# Patient Record
Sex: Female | Born: 1955 | Race: White | Hispanic: No | Marital: Married | Smoking: Never smoker
Health system: Southern US, Community
[De-identification: ages and names within clinical notes are randomized; demographics above are authoritative.]

## PROBLEM LIST (undated history)

## (undated) DIAGNOSIS — Z9109 Other allergy status, other than to drugs and biological substances: Secondary | ICD-10-CM

## (undated) DIAGNOSIS — IMO0002 Reserved for concepts with insufficient information to code with codable children: Secondary | ICD-10-CM

## (undated) DIAGNOSIS — S0291XA Unspecified fracture of skull, initial encounter for closed fracture: Secondary | ICD-10-CM

## (undated) DIAGNOSIS — G8929 Other chronic pain: Secondary | ICD-10-CM

## (undated) DIAGNOSIS — R269 Unspecified abnormalities of gait and mobility: Principal | ICD-10-CM

## (undated) DIAGNOSIS — M549 Dorsalgia, unspecified: Secondary | ICD-10-CM

## (undated) DIAGNOSIS — S060XAA Concussion with loss of consciousness status unknown, initial encounter: Secondary | ICD-10-CM

## (undated) DIAGNOSIS — B009 Herpesviral infection, unspecified: Secondary | ICD-10-CM

## (undated) DIAGNOSIS — E039 Hypothyroidism, unspecified: Secondary | ICD-10-CM

## (undated) DIAGNOSIS — S060X9A Concussion with loss of consciousness of unspecified duration, initial encounter: Secondary | ICD-10-CM

## (undated) DIAGNOSIS — C4490 Unspecified malignant neoplasm of skin, unspecified: Secondary | ICD-10-CM

## (undated) DIAGNOSIS — K589 Irritable bowel syndrome without diarrhea: Secondary | ICD-10-CM

## (undated) DIAGNOSIS — N951 Menopausal and female climacteric states: Secondary | ICD-10-CM

## (undated) DIAGNOSIS — M1990 Unspecified osteoarthritis, unspecified site: Secondary | ICD-10-CM

## (undated) DIAGNOSIS — C4491 Basal cell carcinoma of skin, unspecified: Secondary | ICD-10-CM

## (undated) DIAGNOSIS — Z87828 Personal history of other (healed) physical injury and trauma: Secondary | ICD-10-CM

## (undated) HISTORY — DX: Herpesviral infection, unspecified: B00.9

## (undated) HISTORY — DX: Dorsalgia, unspecified: M54.9

## (undated) HISTORY — DX: Other allergy status, other than to drugs and biological substances: Z91.09

## (undated) HISTORY — PX: TENDON REPAIR: SHX5111

## (undated) HISTORY — DX: Concussion with loss of consciousness of unspecified duration, initial encounter: S06.0X9A

## (undated) HISTORY — DX: Irritable bowel syndrome, unspecified: K58.9

## (undated) HISTORY — PX: COLONOSCOPY: SHX174

## (undated) HISTORY — DX: Personal history of other (healed) physical injury and trauma: Z87.828

## (undated) HISTORY — DX: Menopausal and female climacteric states: N95.1

## (undated) HISTORY — DX: Basal cell carcinoma of skin, unspecified: C44.91

## (undated) HISTORY — DX: Other chronic pain: G89.29

## (undated) HISTORY — PX: OTHER SURGICAL HISTORY: SHX169

## (undated) HISTORY — DX: Unspecified osteoarthritis, unspecified site: M19.90

## (undated) HISTORY — DX: Unspecified fracture of skull, initial encounter for closed fracture: S02.91XA

## (undated) HISTORY — DX: Hypothyroidism, unspecified: E03.9

## (undated) HISTORY — DX: Concussion with loss of consciousness status unknown, initial encounter: S06.0XAA

## (undated) HISTORY — PX: COSMETIC SURGERY: SHX468

## (undated) HISTORY — DX: Unspecified abnormalities of gait and mobility: R26.9

## (undated) HISTORY — PX: ORIF ANKLE FRACTURE: SUR919

## (undated) HISTORY — PX: CARPAL TUNNEL RELEASE: SHX101

## (undated) HISTORY — DX: Unspecified malignant neoplasm of skin, unspecified: C44.90

## (undated) HISTORY — DX: Reserved for concepts with insufficient information to code with codable children: IMO0002

---

## 1988-12-03 HISTORY — PX: BREAST CYST ASPIRATION: SHX578

## 1990-12-03 HISTORY — PX: TUBAL LIGATION: SHX77

## 1998-10-26 ENCOUNTER — Other Ambulatory Visit: Admission: RE | Admit: 1998-10-26 | Discharge: 1998-10-26 | Payer: Self-pay | Admitting: Obstetrics and Gynecology

## 2000-03-22 ENCOUNTER — Other Ambulatory Visit: Admission: RE | Admit: 2000-03-22 | Discharge: 2000-03-22 | Payer: Self-pay | Admitting: Obstetrics and Gynecology

## 2000-10-25 ENCOUNTER — Inpatient Hospital Stay (HOSPITAL_COMMUNITY): Admission: EM | Admit: 2000-10-25 | Discharge: 2000-10-28 | Payer: Self-pay

## 2001-04-23 ENCOUNTER — Ambulatory Visit (HOSPITAL_BASED_OUTPATIENT_CLINIC_OR_DEPARTMENT_OTHER): Admission: RE | Admit: 2001-04-23 | Discharge: 2001-04-23 | Payer: Self-pay | Admitting: *Deleted

## 2002-03-04 ENCOUNTER — Ambulatory Visit (HOSPITAL_BASED_OUTPATIENT_CLINIC_OR_DEPARTMENT_OTHER): Admission: RE | Admit: 2002-03-04 | Discharge: 2002-03-04 | Payer: Self-pay | Admitting: *Deleted

## 2002-11-15 ENCOUNTER — Emergency Department (HOSPITAL_COMMUNITY): Admission: EM | Admit: 2002-11-15 | Discharge: 2002-11-15 | Payer: Self-pay | Admitting: Emergency Medicine

## 2003-12-04 HISTORY — PX: HYSTEROSCOPY WITH NOVASURE: SHX5574

## 2005-12-03 HISTORY — PX: FRACTURE SURGERY: SHX138

## 2005-12-12 ENCOUNTER — Ambulatory Visit (HOSPITAL_BASED_OUTPATIENT_CLINIC_OR_DEPARTMENT_OTHER): Admission: RE | Admit: 2005-12-12 | Discharge: 2005-12-12 | Payer: Self-pay | Admitting: *Deleted

## 2006-02-02 ENCOUNTER — Ambulatory Visit (HOSPITAL_COMMUNITY): Admission: RE | Admit: 2006-02-02 | Discharge: 2006-02-02 | Payer: Self-pay | Admitting: Orthopedic Surgery

## 2006-05-13 ENCOUNTER — Emergency Department: Payer: Self-pay | Admitting: General Practice

## 2006-05-16 ENCOUNTER — Emergency Department: Payer: Self-pay | Admitting: Emergency Medicine

## 2007-03-29 ENCOUNTER — Emergency Department (HOSPITAL_COMMUNITY): Admission: EM | Admit: 2007-03-29 | Discharge: 2007-03-29 | Payer: Self-pay | Admitting: *Deleted

## 2007-04-01 ENCOUNTER — Encounter: Admission: RE | Admit: 2007-04-01 | Discharge: 2007-04-01 | Payer: Self-pay | Admitting: *Deleted

## 2007-04-14 DIAGNOSIS — C4491 Basal cell carcinoma of skin, unspecified: Secondary | ICD-10-CM

## 2007-04-14 HISTORY — DX: Basal cell carcinoma of skin, unspecified: C44.91

## 2007-08-15 ENCOUNTER — Ambulatory Visit: Payer: Self-pay | Admitting: Unknown Physician Specialty

## 2007-08-21 ENCOUNTER — Ambulatory Visit: Payer: Self-pay | Admitting: Unknown Physician Specialty

## 2008-01-22 ENCOUNTER — Ambulatory Visit: Payer: Self-pay | Admitting: Internal Medicine

## 2008-02-03 ENCOUNTER — Ambulatory Visit: Payer: Self-pay | Admitting: Internal Medicine

## 2008-09-08 ENCOUNTER — Ambulatory Visit: Payer: Self-pay | Admitting: Family Medicine

## 2009-04-11 ENCOUNTER — Ambulatory Visit: Payer: Self-pay | Admitting: Unknown Physician Specialty

## 2009-04-14 ENCOUNTER — Ambulatory Visit: Payer: Self-pay | Admitting: Oral and Maxillofacial Surgery

## 2010-06-11 ENCOUNTER — Emergency Department: Payer: Self-pay | Admitting: Unknown Physician Specialty

## 2010-09-12 ENCOUNTER — Ambulatory Visit: Payer: Self-pay | Admitting: Unknown Physician Specialty

## 2010-12-03 HISTORY — PX: BACK SURGERY: SHX140

## 2011-04-20 NOTE — Op Note (Signed)
NAMECHRISTIEN, BERTHELOT                ACCOUNT NO.:  [id number]   MEDICAL RECORD NO.:  [id number]          PATIENT TYPE:  AMB   LOCATION:  DSC                          FACILITY:  [facility]   PHYSICIAN:  Lowell Bouton, M.D.DATE OF BIRTH:  [birth year]   DATE OF PROCEDURE:  12/12/2005  DATE OF DISCHARGE:                                 OPERATIVE REPORT   PREOP DIAGNOSIS:  Status post open reduction internal fixation left humerus.   POSTOP DIAGNOSIS:  Status post open reduction internal fixation left  humerus.   PROCEDURE:  Removal of plate and screws left humerus.   SURGEON:  Lowell Bouton, M.D.   ANESTHESIA:  General.   OPERATIVE FINDINGS:  The patient had only mild scarring, but did have some  bone in growth around the screw holes. The fracture was healed.   DESCRIPTION OF PROCEDURE:  Procedure under general anesthesia, the left arm  was prepped and draped in usual fashion and a longitudinal incision was made  in line with the previous scar using an anterolateral approach. Sharp  dissection was carried through the subcutaneous tissues and bleeding points  were coagulated. Blunt dissection was carried down to the muscle and  Weitlaner retractors were inserted for retraction. The muscle was then  split, bluntly, down to the plate; and the plate was exposed both proximally  and distally. Care was taken to protect the lateral antebrachial cutaneous  nerve and the radial nerve. Five screws were then removed; and an osteotome  was used to remove bone from the screw holes. The plate was then elevated  off of the humerus. The lag screw was removed anteriorly. Rongeur was used  to remove any corrosion around the screw holes.   The wound was irrigated copiously with saline. The fascia was closed with 4-  0 Vicryl. A vessel loop drain was left in for drainage.  Subcutaneous tissue  was closed with 4-0 Vicryl. The skin was closed with a 3-0 subcuticular  Prolene; 1/2% Marcaine  was inserted into the wound edges for pain control.  Steri-Strips were applied, followed by a sterile dressing. The patient  tolerated the procedure well, and went to recovery room awake and stable in  good condition.      Lowell Bouton, M.D.  Electronically Signed     EMM/MEDQ  D:  12/12/2005  T:  12/12/2005  Job:  [id number]

## 2011-04-20 NOTE — Op Note (Signed)
[name]. [facility]  Patient:    [name], [name]                       MRN: [id number] Adm. Date:  [id number] Attending:  Kendell Bane                           Operative Report  PREOPERATIVE DIAGNOSIS:  Status post both-bones forearm fracture, left, humerus fracture, left, and severe injury to the dorsum of the left forearm with posterior interosseous nerve transection.  POSTOPERATIVE DIAGNOSIS:  Status post both-bones forearm fracture, left, humerus fracture, left, and severe injury to the dorsum of the left forearm with posterior interosseous nerve transection.  PROCEDURE:  Tendon transfers with flexor carpi radialis to the extensor digitorum communis and palmaris longus tendon to the extensor pollicis longus tendon, left forearm.  SURGEON:  Lowell Bouton, M.D.  ANESTHESIA:  General.  OPERATIVE FINDINGS:  The patient had significant scarring on the dorsum of the forearm.  The extensor tendons were in good condition from the distal forearm out to the hand.  The subcutaneous tunnel between the FCR and the extensor tendons did have some scar tissue that was released.  The palmaris longus to EPL tendon transfer did not have significant scarring.  DESCRIPTION OF PROCEDURE:  Under general anesthesia with a tourniquet on the left arm, the left arm was prepped and draped in the usual fashion, and after exsanguinating the limb, the tourniquet was inflated to 250 mmHg.  A longitudinal incision was made volarly between the FCR and the palmaris longus tendon.  Blunt dissection was carried down to the tendons, and they were transected at their insertion in the wrist.  They were dissected out proximally up to the muscle bellies.  A longitudinal incision was then made over the dorsum of the wrist, extending back onto the forearm.  Sharp dissection was carried down through the subcutaneous tissues, and bleeding points were coagulated.  The  saphenous vein was tied off with a 4-0 chromic suture.  Blunt dissection was done through scar tissue down to the extensor tendons.  The extensor retinaculum was incised, and the tendons were transected at their musculotendinous junction.  The extensor digitorum communis of the central three digits were used for each tendon, and the extensor digiti minimi of the small finger.  They were all transected and brought out through the extensor retinaculum superficial to it in the hand. The flexor carpi radialis was then tunneled subcutaneously in the proximal forearm over the radial side of the radius onto the dorsum of the forearm. The extensor tendons were woven into the flexor carpi radialis tendon using a tendon braider and 4-0 Mersilene suture.  Each tendon was attached twice and braided through the FCR twice.  Four tendons were inserted.  The EIP was left intact.  The tension was set with the wrist in slight extension and the MP joints fully extended.  Maximum pull was placed on the flexor carpi radialis tendon.  After the transfer, the MP extended fully with slight wrist flexion, and a passive fist could be made with wrist extension.  The palmaris longus was then braided into the extensor pollicis longus after bringing the EPL out of the extensor sheath and subcutaneously over the snuffbox.  Mersilene 4-0 was used, and the excess tendon ends were removed.  Again there was good extension of the IP joint of the thumb with good passive  flexion.  The wounds were irrigated copiously.  Bleeding was controlled with electrocautery. Vessel loop drains were left in for drainage, and the subcutaneous tissues were closed with 4-0 Vicryl and the skin with a 3-0 subcuticular Prolene. Steri-Strips were applied, followed by sterile dressings.  The patient was placed in a volar splint with the MPs extended and the IP of the thumb extended.  She tolerated the procedure well and went to the recovery  room awake and stable, in good condition. DD:  04/23/01 TD:  04/23/01 Job: [id number]

## 2011-04-20 NOTE — Op Note (Signed)
NAMEJASNEET, [name]                ACCOUNT NO.:  [id number]   MEDICAL RECORD NO.:  [id number]          PATIENT TYPE:  AMB   LOCATION:  SDS                          FACILITY:  [facility]   PHYSICIAN:  Nadara Mustard, MD     DATE OF BIRTH:  1956-03-15   DATE OF PROCEDURE:  02/02/2006  DATE OF DISCHARGE:  02/02/2006                                 OPERATIVE REPORT   PREOPERATIVE DIAGNOSIS:  Left ankle Weber B fibular fracture.   POSTOPERATIVE DIAGNOSIS:  Left ankle Weber B fibular fracture.   PROCEDURE:  Open reduction and internal fixation of left fibula with locking  plate.   SURGEON:  Nadara Mustard, MD   ANESTHESIA:  General.   ESTIMATED BLOOD LOSS:  Minimal.   ANTIBIOTICS:  One gram of Kefzol.   DRAINS:  None.   COMPLICATIONS:  None.   TOURNIQUET TIME:  None.   DISPOSITION:  To PACU in stable condition.   INDICATION FOR PROCEDURE:  The patient is a 55 year old woman who had her  left ankle stepped on by a horse.  She sustained a closed Weber B fracture  of the fibula.  The deltoid was intact.  Her ankle mortise was congruent.  The patient had displacement of the fibula and presents at this time for  ORIF of the Weber B fibular fracture.  Risks and benefits were discussed  including infection, neurovascular injury, persistent pain, need for  additional surgery, and risk of arthritis.  The patient states he  understands and wishes to proceed at this time.   DESCRIPTION OF PROCEDURE:  The patient was brought to OR room 8 and  underwent a general anesthetic.  After an adequate level of anesthesia was  obtained, the patient's left lower extremity was prepped using DuraPrep and  draped into a sterile field.  A lateral incision was made through the skin  and this was carried sharply down to bone.  Subperiosteal dissection was  used to debride the fracture site.  There was a significant amount of soft  tissue stripping from the traumatic injury.  The wound was irrigated with  normal saline and the fracture was reduced; this was more of a transverse  fracture than a typical oblique Weber B fracture and there was no  interfragmentary screw placed due to the transverse nature of the fracture.  A Synthes locking plate was placed laterally with 3 locking screws  proximally and 2 locking screws distally.  C-arm fluoroscopy verified  reduction in both AP and lateral planes.  The wound was irrigated with  normal saline.  Subcu was closed using 2-0 Vicryl.  The skin was closed  using approximating staples and there was no tension on the skin.  The wound  was covered Adaptic, orthopedic  sponges, sterile Webril and a Coban dressing.  The patient was placed back  in a fracture boot, extubated and taken to PACU in stable condition.   Plan for discharge to home and followup in the office in 2 weeks.      Nadara Mustard, MD  Electronically Signed  MVD/MEDQ  D:  02/02/2006  T:  02/04/2006  Job:  [id number]

## 2011-04-20 NOTE — Op Note (Signed)
[place]. [facility]  Patient:    [name], [name] Visit Number: [id number] MRN: [id number]          Service Type: DSU Location: [facility] Attending Physician:  Kendell Bane Dictated by:   Lowell Bouton, M.D. Proc. Date: 03/05/02 Admit Date:  03/04/2002 Discharge Date: 03/04/2002                             Operative Report  PREOPERATIVE DIAGNOSIS:  Right carpal tunnel syndrome.  POSTOPERATIVE DIAGNOSIS:  Right carpal tunnel syndrome.  OPERATION PERFORMED:  Decompression median nerve, right carpal tunnel.  SURGEON:  Lowell Bouton, M.D.  ANESTHESIA:  0.5% Marcaine local with sedation.  OPERATIVE FINDINGS:  The patient had no masses present in the carpal canal. The motor branch of the nerve was intact.  DESCRIPTION OF PROCEDURE:  Under 0.5% Marcaine local anesthesia with a tourniquet on the right arm, the right hand was prepped and draped in the usual fashion and after exsanguinating the limb, the tourniquet was inflated to 225 mmHg.  A 3 cm longitudinal incision was made in the palm just ulnar to the thenar crease.  Sharp dissection was carried through the subcutaneous tissues and bleeding points were coagulated.  Blunt dissection was carried down through the superficial palmar fascia distal to the transverse carpal ligament.  A hemostat was placed in the carpal canal up against the hook of the hamate and the transverse carpal ligament was divided on the ulnar border of the median nerve.  The proximal end of the ligament was divided with the scissors after dissecting the nerve away from the undersurface of the ligament.  The carpal canal was then palpated and was found to be adequately decompressed.  The nerve was examined and motor branch identified.  The wound was irrigated with saline and the skin was closed with 4-0 nylon sutures. Sterile dressings were applied followed by a volar wrist splint.  The  patient tolerated the procedure well and went to the recovery room awake and stable in good condition. Dictated by:   Lowell Bouton, M.D. Attending Physician:  Kendell Bane DD:  03/05/02 TD:  03/05/02 Job: [id number]

## 2011-04-20 NOTE — H&P (Signed)
. [facility]  Patient:    [name], [name]                       MRN: [id number] Adm. Date:  [id number] Attending:  Trauma, Md                         History and Physical  CHIEF COMPLAINT:  Auger injury, left forearm.  HISTORY OF PRESENT ILLNESS:  The patient is a 55 year old left-handed female who caught her left forearm in an auger and suffered a twisting injury, also fracturing her humerus.  She presented to the emergency room with a grossly contaminated wound on the dorsum of her forearm.  She was found to be unstable at both the forearm and the humerus.  PAST MEDICAL HISTORY:  Past medical history is remarkable for a C-section.  CURRENT MEDICATIONS:  Synthroid and Celebrex.  ALLERGIES:  PENICILLIN -- she develops a rash.  DEMEROL makes her nauseated.  FAMILY HISTORY:  Negative.  SOCIAL HISTORY:  She does not smoke, drinks moderately and has one child and lives with her husband.  [place]  REVIEW OF SYSTEMS:  Negative.  PHYSICAL EXAMINATION  HEENT:  Pupils are equal, round and reactive to light.  Oropharynx is clear. Nasal septum is midline.  NECK:  No adenopathy or bruit.  CHEST:  Clear to auscultation.  HEART:  Regular without a murmur.  ABDOMEN:  Soft, nontender without masses.  EXTREMITIES:  Exam is normal with the exception of the left upper extremity. There is no open wound over the humerus but an obvious fracture with an unstable humerus.  There is a very large wound with all the muscle bellies protruding dorsally on the forearm.  There is a 4-cm hypothenar laceration. Sensation is completely intact and circulation is present.  X-RAY FINDINGS:  X-rays reveal an oblique fracture of the humerus with segmental fractures of the radius and ulna.  DIAGNOSIS:  Open fracture, left forearm, with lacerated extensor tendons and closed fracture, left humerus.  PLAN:  The patient was taken to the operating room where she  underwent repair of all her injuries.  She will be admitted postoperatively for IV antibiotics. DD:  10/25/00 TD:  10/26/00 Job: [id number]

## 2011-04-20 NOTE — Op Note (Signed)
[place]. [facility]  Patient:    [name], [name]                       MRN: [id number] Proc. Date: 10/25/00 Adm. Date:  [id number] Attending:  Kendell Bane                           Operative Report  PREOPERATIVE DIAGNOSIS:  Left closed mid shaft humerus fracture.  POSTOPERATIVE DIAGNOSIS:  Left closed mid shaft humerus fracture.  PROCEDURE:  Open reduction, internal fixation, and compression plating, left mid shaft humerus fracture.  SURGEON:  Mark C. Ophelia Charter, M.D.  ASSISTANT:  Lowell Bouton, M.D.  ANESTHESIA:  General.  TOURNIQUET:  None.  This surgery was done prior to Dr. Metro Kung surgery which was on the forearm with plating of the both bone forearm fracture and repair of open dorsal mid forearm tendon and muscle lacerations.  The patient had a floating elbow and humerus stabilization was required prior to plating and fixation of the comminuted radius and ulnar fractures.  Please see her operative note for description of the forearm procedures.  DESCRIPTION OF PROCEDURE:  After induction of general anesthesia oral tracheal intubation, the arm was prepped and scrubbed from the fingertips up to the axilla, and the shoulder region was squared off with towels.  Uderates were used, stockinette with Coban and a green towel applied to the forearm over the open injuries and wrapped with pressure to decrease intraoperative blood loss. Sterile skin marker was used with an anterolateral approach to the humerus. Radial nerve was identified after blunt dissection in its normal interval and followed from distal to proximal carefully fraying it up.  Subperiosteal dissection was performed on the distal fragment of the humerus with care taken to make sure the radial nerve was loose and free.  Proximal fragment was medial and anterior and once the nerve was freed up past the proximal fragment, the fracture was reduced, held with  self-retaining clamps in an anatomic position.  A 4.5 compression plate was selected, applied to the humerus and then removed and bent slightly for the slight ______ in the distal humerus.  Due to the round shape of the humerus, the plate extended slightly from posteroproximal to the anterodistal but held the fracture in the anatomic position.  An air fragmentary screw was applied first holding the oblique short fracture in the anatomic position, and the compression plate with all holes drilled in neutral position using the Green drill guide.  The screws were measured; self-tapping 4.5 cortical screws were inserted.  Radial nerve was carefully inspected and checked throughout the procedure and was loose at the end of procedure and undamaged.  The patient was neurologically intact prior to the injury and after irrigation with saline solution, subcutaneous tissue was reapproximated with 2-0 Vicryl.  There was minimal bleeding and no drain was placed, and skin was closed with a running nylon suture so that a sterile tourniquet could be applied for the forearm procedure as described in Dr. Metro Kung operative note.  At the conclusion of this procedure,  the sterile tourniquet was applied and forearm tendons, muscle and compression plate fixation was performed on the forearm. DD:  10/28/11 TD:  10/27/00 Job: [id number]

## 2011-09-02 ENCOUNTER — Emergency Department (HOSPITAL_COMMUNITY): Payer: PRIVATE HEALTH INSURANCE

## 2011-09-02 ENCOUNTER — Inpatient Hospital Stay (HOSPITAL_COMMUNITY): Payer: PRIVATE HEALTH INSURANCE

## 2011-09-02 ENCOUNTER — Encounter (HOSPITAL_COMMUNITY): Payer: Self-pay | Admitting: Radiology

## 2011-09-02 ENCOUNTER — Inpatient Hospital Stay (HOSPITAL_COMMUNITY)
Admission: EM | Admit: 2011-09-02 | Discharge: 2011-09-08 | DRG: 460 | Disposition: A | Discharge status: Home / Self Care | Payer: PRIVATE HEALTH INSURANCE | Attending: Neurosurgery | Admitting: Neurosurgery

## 2011-09-02 DIAGNOSIS — Y9389 Activity, other specified: Secondary | ICD-10-CM

## 2011-09-02 DIAGNOSIS — S32009A Unspecified fracture of unspecified lumbar vertebra, initial encounter for closed fracture: Principal | ICD-10-CM | POA: Diagnosis present

## 2011-09-02 DIAGNOSIS — Y998 Other external cause status: Secondary | ICD-10-CM

## 2011-09-02 DIAGNOSIS — W1789XA Other fall from one level to another, initial encounter: Secondary | ICD-10-CM | POA: Diagnosis present

## 2011-09-02 DIAGNOSIS — Y9239 Other specified sports and athletic area as the place of occurrence of the external cause: Secondary | ICD-10-CM

## 2011-09-02 LAB — URINE MICROSCOPIC-ADD ON

## 2011-09-02 LAB — CBC
Hemoglobin: 12.7 g/dL (ref 12.0–15.0)
MCH: 29.9 pg (ref 26.0–34.0)
MCV: 87.5 fL (ref 78.0–100.0)
RBC: 4.25 MIL/uL (ref 3.87–5.11)

## 2011-09-02 LAB — COMPREHENSIVE METABOLIC PANEL
Albumin: 3.8 g/dL (ref 3.5–5.2)
Alkaline Phosphatase: 50 U/L (ref 39–117)
BUN: 20 mg/dL (ref 6–23)
Chloride: 101 mEq/L (ref 96–112)
Potassium: 3.1 mEq/L — ABNORMAL LOW (ref 3.5–5.1)
Total Bilirubin: 0.2 mg/dL — ABNORMAL LOW (ref 0.3–1.2)

## 2011-09-02 LAB — URINALYSIS, ROUTINE W REFLEX MICROSCOPIC
Bilirubin Urine: NEGATIVE
Leukocytes, UA: NEGATIVE
Nitrite: NEGATIVE
Specific Gravity, Urine: 1.01 (ref 1.005–1.030)
pH: 7.5 (ref 5.0–8.0)

## 2011-09-02 LAB — POCT I-STAT, CHEM 8
BUN: 20 mg/dL (ref 6–23)
Creatinine, Ser: 0.7 mg/dL (ref 0.50–1.10)
Hemoglobin: 13.6 g/dL (ref 12.0–15.0)
Potassium: 3.2 mEq/L — ABNORMAL LOW (ref 3.5–5.1)
Sodium: 140 mEq/L (ref 135–145)

## 2011-09-02 LAB — PROTIME-INR: Prothrombin Time: 12.6 seconds (ref 11.6–15.2)

## 2011-09-02 LAB — LACTIC ACID, PLASMA: Lactic Acid, Venous: 2.2 mmol/L (ref 0.5–2.2)

## 2011-09-02 LAB — ABO/RH: ABO/RH(D): O POS

## 2011-09-02 MED ORDER — IOHEXOL 300 MG/ML  SOLN
80.0000 mL | Freq: Once | INTRAMUSCULAR | Status: AC | PRN
  Administered 2011-09-02: 80 mL via INTRAVENOUS

## 2011-09-03 LAB — CBC
MCH: 29.8 pg (ref 26.0–34.0)
MCHC: 33.6 g/dL (ref 30.0–36.0)
MCV: 88.9 fL (ref 78.0–100.0)
Platelets: 160 10*3/uL (ref 150–400)
RDW: 13.8 % (ref 11.5–15.5)

## 2011-09-05 ENCOUNTER — Inpatient Hospital Stay (HOSPITAL_COMMUNITY): Payer: PRIVATE HEALTH INSURANCE

## 2011-09-06 DIAGNOSIS — S32009A Unspecified fracture of unspecified lumbar vertebra, initial encounter for closed fracture: Secondary | ICD-10-CM

## 2011-09-07 NOTE — H&P (Signed)
[name]                ACCOUNT NO.:  [id number]  MEDICAL RECORD NO.:  [id number]  LOCATION:  [facility]                         FACILITY:  [facility]  PHYSICIAN:  Hilda Lias, M.D.   DATE OF BIRTH:  March 09, 1956  DATE OF ADMISSION:  09/02/2011 DATE OF DISCHARGE:                             HISTORY & PHYSICAL   [name] is a 55 year old female who today was in a beer stand and she fell.  She landed on her back.  Immediately, she complained of lower back pain and tingling sensation in both the legs.  She denies any loss of consciousness.  She was got into the hospital.  Because of the findings, she was now admitted to [facility] to have a complete evaluation per the emergency room physician, Dr. Nicanor Alcon.  Because of the finding, we were called.  She fell from a height of about 15 feet. At the present time, she is complaining of intensive lower back pain, quite sensitive in both legs.  PAST MEDICAL HISTORY:  She had surgery of the hand and fusion of the right ankle.  She is not allergic to any medication.  SOCIAL HISTORY:  She drinks socially.  FAMILY HISTORY:  Unremarkable.  REVIEW OF SYSTEMS:  Positive mostly for some headache.  MEDICATIONS:  She is taking Celebrex and some other medication that she does not recall.  PHYSICAL EXAMINATION:  VITAL SIGNS:  Blood pressure 130/80, pulse of 86, respiratory rate of 16, temperature is 97.8. HEAD, EARS, THROAT:  Normal. NECK:  She is in a cervical collar.  She has no tenderness to palpation anterior-posterior. LUNGS:  Clear. HEART:  Sound normal. ABDOMEN:  Normal. EXTREMITIES:  Normal pulse. NEUROLOGIC:  She is oriented x3.  Strength normal in the upper extremity.  She is able to move both legs.  There is no weakness that we can see, although there is quite a restriction secondary to the neuropathic pain that she is complaining about.  Reflexes 2+.  No Babinski.  Sensation, she complains of hypesthesias in the  lower extremity.  Appropriate proprioception is normal. RECTAL:  Done by the emergency room doctor and was completely normal.  The CT scan of the head is negative.  The CT scan of the cervical spine showed degenerative changes, but no fracture.  Thoracic spine normal. The CT scan of the abdomen and pelvis, there is no evidence of injury of abdominal cavity, but there is a fracture of L1 burst with 50% compromise of the canal.  CLINICAL IMPRESSION:  Fracture of L1 with 50% canal compromise.  RECOMMENDATIONS:  Since the x-ray of the cervical spine is negative, she is going to be off the collar.  She is going to be eventually seen by the Trauma Service just to be sure that there is no other associated injury with the effect of the lumbar spine.  We are going to do some reconstruction of the thoracolumbar area.  I showed the x-ray to the hospital and he is fully aware that she is going to need surgery urgently with emergency.  I told him and probably we should be able to the surgery today about 9 to 11 o'clock in the morning.  Nevertheless, since she has this surgery performed by orthopedic surgeon, I told her to feel free to get a second opinion by any of the orthopedic surgeon that he knows about.  I told that we encourage second opinion.  The procedure so far will be fusion with pedicle screws from T10-T11 to L1- L2.  This will be also with posterolateral arthrodesis using allograft and BMP.  The surgery of course including possibility of CSF leak, infection, hematoma, paralysis, and need for surgery.          ______________________________ Hilda Lias, M.D.     EB/MEDQ  D:  09/02/2011  T:  09/02/2011  Job:  [id number]  Electronically Signed by Hilda Lias M.D. on 09/07/2011 06:18:36 PM

## 2011-09-07 NOTE — Op Note (Signed)
[name], [name]                ACCOUNT NO.:  [id number]  MEDICAL RECORD NO.:  [id number]  LOCATION:                                 FACILITY:  PHYSICIAN:  Hilda Lias, M.D.   DATE OF BIRTH:  May 28, 1956  DATE OF PROCEDURE: DATE OF DISCHARGE:                              OPERATIVE REPORT   PREOPERATIVE DIAGNOSES:  Fracture of L1.  Bilateral sensory neuropathy.  POSTOPERATIVE DIAGNOSES:  Fracture of L1.  Bilateral sensory neuropathy.  PROCEDURE:  Fuse on the lumbar spine from T11-L3 with pedicle screws. Posterolateral arthrodesis with Vitoss and BMP.  Cell saver.  CR.  SURGEON:  Hilda Lias, MD  ASSISTANT:  Tia Alert, MD  CLINICAL HISTORY:  [name] is a 55 year old female who last night while she was deer hunting, fell from the deer stand.  She landed on her back and had a serious injury on the lumbar area which fracture of L1, compromise of  50% of the canal. she has sensory __________ radiculopathy.  The rectal examination was normal, although injuries were negative.  The patient had a x-ray, which showed fracture of L1 with the canal being compromised about 50%.  Because of that, surgery was advised.  She and her husband knew the risk of the surgery including paralysis and need for further surgery.  PROCEDURE:  The patient was taken to the OR and after intubation, she was positioned in prone manner.  The back was cleaned with DuraPrep.  X- rays showed indeed we were right at the level of T12-L1.  A midline incision from T9 down to L2-L3 was made.  Muscles were retracted laterally and we were able to visualize the transverse process as well as the ribs.  Then using the C-arm, we localized the fracture of L1. When she went from prone to supine position, it was almost completely correction of the defect with minimal bone in the canal.  From there, using the C-arm in the AP and lateral view, we probed the pedicle of T11 and T12 bilaterally.  Each pedicle was  probed and prior to insertion of the screws, we were able to fill the _four quadrants with bone_________.  At this level, we used pedicle screws of 5.5 x 45 __________ at the length at T12 and it was only a length of 40.  Then, we went down below the fracture and we probed the pedicles of L4 and L3.  We were going to use 4 screws of the same length of 5.5 x 45.  X-ray in the AP and lateral view showed that indeed there were good position on the pedicle screws.  Then, the screw were kept in place__________ from T11 to L3 using __rods________.  This procedure was done bilaterally. Then, using the drill, we were able to remove the _periostium_________ lateral aspect of the sacral spine of T10-T11, 11-12, L1, L2, L3; also in the intralaminar aspect from T11, 12, 3.  Then, a mix of BMP and Vitoss was used for arthrodesis.  After we obtained good hemostasis, we proceeded with putting a Hemovac drain.  Then, the wound was closed with Vicryl and __several________ with Steri-Strips.  The patient is going  to go to the ICU.          ______________________________ Hilda Lias, M.D.     EB/MEDQ  D:  09/02/2011  T:  09/03/2011  Job:  [id number]  Electronically Signed by Hilda Lias M.D. on 09/07/2011 06:21:47 PM
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
## 2011-10-08 ENCOUNTER — Ambulatory Visit (HOSPITAL_COMMUNITY)
Admission: RE | Admit: 2011-10-08 | Discharge: 2011-10-08 | Disposition: A | Discharge status: Home / Self Care | Payer: PRIVATE HEALTH INSURANCE | Source: Ambulatory Visit | Attending: Neurosurgery | Admitting: Neurosurgery

## 2011-10-08 DIAGNOSIS — R52 Pain, unspecified: Secondary | ICD-10-CM

## 2011-10-08 DIAGNOSIS — M79609 Pain in unspecified limb: Secondary | ICD-10-CM | POA: Insufficient documentation

## 2011-10-08 DIAGNOSIS — M7989 Other specified soft tissue disorders: Secondary | ICD-10-CM | POA: Insufficient documentation

## 2011-10-08 NOTE — Progress Notes (Signed)
*  PRELIMINARY RESULTS*  Left lower extremity venous duplex has been performed.  Exam is negative for deep and superficial vein thrombosis.  [name] 10/08/2011, 6:07 PM

## 2012-05-20 ENCOUNTER — Ambulatory Visit: Payer: Self-pay | Admitting: Internal Medicine

## 2013-01-28 ENCOUNTER — Other Ambulatory Visit: Payer: Self-pay | Admitting: Neurosurgery

## 2013-01-28 DIAGNOSIS — M542 Cervicalgia: Secondary | ICD-10-CM

## 2013-02-01 ENCOUNTER — Ambulatory Visit
Admission: RE | Admit: 2013-02-01 | Discharge: 2013-02-01 | Disposition: A | Discharge status: Home / Self Care | Payer: PRIVATE HEALTH INSURANCE | Source: Ambulatory Visit | Attending: Neurosurgery | Admitting: Neurosurgery

## 2013-02-01 DIAGNOSIS — M542 Cervicalgia: Secondary | ICD-10-CM

## 2013-05-25 ENCOUNTER — Ambulatory Visit: Payer: Self-pay | Admitting: Internal Medicine

## 2013-05-29 ENCOUNTER — Ambulatory Visit: Payer: Self-pay | Admitting: Physical Medicine and Rehabilitation

## 2013-09-21 ENCOUNTER — Emergency Department (HOSPITAL_COMMUNITY)
Admission: EM | Admit: 2013-09-21 | Discharge: 2013-09-21 | Disposition: A | Discharge status: Home / Self Care | Payer: PRIVATE HEALTH INSURANCE | Attending: Emergency Medicine | Admitting: Emergency Medicine

## 2013-09-21 ENCOUNTER — Encounter (HOSPITAL_COMMUNITY): Payer: Self-pay | Admitting: Emergency Medicine

## 2013-09-21 ENCOUNTER — Emergency Department (HOSPITAL_COMMUNITY): Payer: PRIVATE HEALTH INSURANCE

## 2013-09-21 DIAGNOSIS — Y929 Unspecified place or not applicable: Secondary | ICD-10-CM | POA: Insufficient documentation

## 2013-09-21 DIAGNOSIS — M549 Dorsalgia, unspecified: Secondary | ICD-10-CM

## 2013-09-21 DIAGNOSIS — I10 Essential (primary) hypertension: Secondary | ICD-10-CM | POA: Insufficient documentation

## 2013-09-21 DIAGNOSIS — Z79899 Other long term (current) drug therapy: Secondary | ICD-10-CM | POA: Insufficient documentation

## 2013-09-21 DIAGNOSIS — J45909 Unspecified asthma, uncomplicated: Secondary | ICD-10-CM | POA: Insufficient documentation

## 2013-09-21 DIAGNOSIS — Y9389 Activity, other specified: Secondary | ICD-10-CM | POA: Insufficient documentation

## 2013-09-21 DIAGNOSIS — Z8739 Personal history of other diseases of the musculoskeletal system and connective tissue: Secondary | ICD-10-CM | POA: Insufficient documentation

## 2013-09-21 DIAGNOSIS — E119 Type 2 diabetes mellitus without complications: Secondary | ICD-10-CM | POA: Insufficient documentation
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
## 2013-09-22 NOTE — ED Provider Notes (Signed)
Medical screening examination/treatment/procedure(s) were performed by non-physician practitioner and as supervising physician I was immediately available for consultation/collaboration.  Emelynn Rance F Levell Tavano, MD 09/22/13 0954 

## 2013-09-24 ENCOUNTER — Ambulatory Visit: Payer: Self-pay | Admitting: Internal Medicine

## 2013-11-24 ENCOUNTER — Encounter: Payer: Self-pay | Admitting: Neurology

## 2013-11-27 ENCOUNTER — Encounter: Payer: Self-pay | Admitting: Neurology

## 2013-11-27 ENCOUNTER — Encounter (INDEPENDENT_AMBULATORY_CARE_PROVIDER_SITE_OTHER): Payer: Self-pay

## 2013-11-27 ENCOUNTER — Ambulatory Visit (INDEPENDENT_AMBULATORY_CARE_PROVIDER_SITE_OTHER): Payer: PRIVATE HEALTH INSURANCE | Admitting: Neurology

## 2013-11-27 ENCOUNTER — Other Ambulatory Visit: Payer: Self-pay | Admitting: Neurology

## 2013-11-27 VITALS — BP 112/72 | HR 60 | Temp 97.3°F | Ht 64.0 in | Wt 139.0 lb

## 2013-11-27 DIAGNOSIS — R269 Unspecified abnormalities of gait and mobility: Secondary | ICD-10-CM

## 2013-11-27 HISTORY — DX: Unspecified abnormalities of gait and mobility: R26.9
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
## 2013-11-27 NOTE — Patient Instructions (Signed)

## 2013-12-04 LAB — VITAMIN B12: Vitamin B-12: 796 pg/mL (ref 211–946)

## 2013-12-04 LAB — PLEASE NOTE

## 2013-12-08 ENCOUNTER — Telehealth: Payer: Self-pay | Admitting: *Deleted

## 2013-12-08 NOTE — Telephone Encounter (Signed)
I called pt back after consulting Dr. Jannifer Franklin about her TSH level which had not been done.  He did want to have done.   I spoke with pt and she will come in here to have done. Relayed the hours for this.  She verbalized understanding.

## 2013-12-10 ENCOUNTER — Telehealth: Payer: Self-pay | Admitting: Neurology

## 2013-12-10 ENCOUNTER — Other Ambulatory Visit (INDEPENDENT_AMBULATORY_CARE_PROVIDER_SITE_OTHER): Payer: Self-pay

## 2013-12-10 DIAGNOSIS — R269 Unspecified abnormalities of gait and mobility: Secondary | ICD-10-CM

## 2013-12-10 DIAGNOSIS — Z0289 Encounter for other administrative examinations: Secondary | ICD-10-CM

## 2013-12-10 NOTE — Addendum Note (Signed)
Addended by: Margette Fast on: 12/10/2013 06:53 PM   Modules accepted: Orders

## 2013-12-10 NOTE — Telephone Encounter (Signed)
Left message for patient that her MRI's are without contrast and therefore BUN and Creatinine are not necessary.

## 2013-12-10 NOTE — Telephone Encounter (Signed)
Dr. Jannifer Franklin I re checked her order and see that the MRI cervical is with and without contrast.  Should I add BUN and Creatinine to her lab order?

## 2013-12-10 NOTE — Telephone Encounter (Signed)
The MRI of the cervical spine was ordered with contrast. I think that this was a mistake. I will order a study without contrast. I was unable to discontinue the other order.

## 2013-12-10 NOTE — Telephone Encounter (Signed)
Patient at check out today states that BUN and Creatinine blood test is needed before MRI, however there was no order for it today when she came in for labs and only got TSH. Patient is wondering if the test can be done with the blood that was already taken today. If questions, please call the patient.

## 2013-12-11 LAB — TSH: TSH: 1.59 u[IU]/mL (ref 0.450–4.500)

## 2013-12-16 ENCOUNTER — Other Ambulatory Visit: Payer: PRIVATE HEALTH INSURANCE

## 2013-12-16 ENCOUNTER — Inpatient Hospital Stay: Admission: RE | Admit: 2013-12-16 | Payer: PRIVATE HEALTH INSURANCE | Source: Ambulatory Visit

## 2013-12-19 ENCOUNTER — Ambulatory Visit
Admission: RE | Admit: 2013-12-19 | Discharge: 2013-12-19 | Disposition: A | Discharge status: Home / Self Care | Payer: PRIVATE HEALTH INSURANCE | Source: Ambulatory Visit | Attending: Neurology | Admitting: Neurology

## 2013-12-19 DIAGNOSIS — R269 Unspecified abnormalities of gait and mobility: Secondary | ICD-10-CM

## 2013-12-21 ENCOUNTER — Telehealth: Payer: Self-pay | Admitting: Neurology

## 2013-12-21 NOTE — Telephone Encounter (Signed)
I called patient. The MRI of the brain was reported as normal. The MRI the cervical spine was done, the report is not available. By my view, there is no evidence of spinal cord compression, there is some straightening of the spine. I discussed this with patient. If the formal report is different, I will call the patient. The patient believes that she is doing better with her headaches and pain and tremors on Cymbalta, but she can only tolerate a 30 mg dose. The patient has been working on balance exercises, and she believes that her balance is improving. We'll watch the patient conservatively for now. The blood work done previously to include a B12 level and a thyroid profile was normal.  IMPRESSION: Normal MRI of brain without contrast.

## 2014-05-17 ENCOUNTER — Ambulatory Visit: Payer: Self-pay | Admitting: Adult Health

## 2014-05-19 ENCOUNTER — Ambulatory Visit: Payer: PRIVATE HEALTH INSURANCE | Admitting: Neurology

## 2014-07-13 DIAGNOSIS — M5116 Intervertebral disc disorders with radiculopathy, lumbar region: Secondary | ICD-10-CM | POA: Insufficient documentation

## 2014-07-13 DIAGNOSIS — M48061 Spinal stenosis, lumbar region without neurogenic claudication: Secondary | ICD-10-CM | POA: Insufficient documentation

## 2014-07-21 LAB — HM PAP SMEAR: HM Pap smear: NEGATIVE

## 2014-08-20 ENCOUNTER — Ambulatory Visit: Payer: Self-pay | Admitting: Obstetrics and Gynecology

## 2014-08-20 LAB — HM MAMMOGRAPHY

## 2015-07-27 ENCOUNTER — Ambulatory Visit (INDEPENDENT_AMBULATORY_CARE_PROVIDER_SITE_OTHER): Payer: PRIVATE HEALTH INSURANCE | Admitting: Obstetrics and Gynecology

## 2015-07-27 ENCOUNTER — Encounter: Payer: Self-pay | Admitting: Obstetrics and Gynecology

## 2015-07-27 VITALS — BP 99/62 | HR 57 | Ht 65.0 in | Wt 138.2 lb

## 2015-07-27 DIAGNOSIS — Z0000 Encounter for general adult medical examination without abnormal findings: Secondary | ICD-10-CM | POA: Diagnosis not present

## 2015-07-27 DIAGNOSIS — G8929 Other chronic pain: Secondary | ICD-10-CM | POA: Insufficient documentation

## 2015-07-27 DIAGNOSIS — Z780 Asymptomatic menopausal state: Secondary | ICD-10-CM | POA: Diagnosis not present

## 2015-07-27 DIAGNOSIS — R894 Abnormal immunological findings in specimens from other organs, systems and tissues: Secondary | ICD-10-CM

## 2015-07-27 DIAGNOSIS — Z9889 Other specified postprocedural states: Secondary | ICD-10-CM

## 2015-07-27 DIAGNOSIS — Z01419 Encounter for gynecological examination (general) (routine) without abnormal findings: Secondary | ICD-10-CM

## 2015-07-27 DIAGNOSIS — E079 Disorder of thyroid, unspecified: Secondary | ICD-10-CM | POA: Insufficient documentation

## 2015-07-27 DIAGNOSIS — K589 Irritable bowel syndrome without diarrhea: Secondary | ICD-10-CM | POA: Insufficient documentation

## 2015-07-27 DIAGNOSIS — R7689 Other specified abnormal immunological findings in serum: Secondary | ICD-10-CM | POA: Insufficient documentation

## 2015-07-27 DIAGNOSIS — R768 Other specified abnormal immunological findings in serum: Secondary | ICD-10-CM | POA: Insufficient documentation

## 2015-07-27 DIAGNOSIS — Z1239 Encounter for other screening for malignant neoplasm of breast: Secondary | ICD-10-CM | POA: Diagnosis not present

## 2015-07-27 NOTE — Progress Notes (Signed)
Patient ID: [name], female   DOB: Feb 15, 1956, 59 y.o.   MRN: [id number] ANNUAL PREVENTATIVE CARE GYN  ENCOUNTER NOTE  Subjective:       [name] is a 59 y.o. G13P1020 female here for a routine annual gynecologic exam.  Current complaints: 1.  2 weeks ago herpes outbreak- lesion felt nodular- took valtrex and resolved   Gynecologic History No LMP recorded. Patient has had an ablation. Contraception: post menopausal status Last Pap: 8/2015neg/neg. Results were: normal Last mammogram: 08/2014. Results were: normal  Obstetric History OB History  Gravida Para Term Preterm AB SAB TAB Ectopic Multiple Living  3 1 1  2          # Outcome Date GA Lbr Len/2nd Weight Sex Delivery Anes PTL Lv  3 AB           2 AB           1 Term      CS-LTranv         Past Medical History  Diagnosis Date  . Abnormality of gait 11/27/2013  . Hypothyroid   . Irritable bowel   . Concussion   . History of subdural hematoma (post traumatic)     Right temporal  . Arthritis     Left shoulder  . Skin cancer   . Chronic back pain     and neck pain  . Skull fracture   . Environmental allergies   . Herpes   . Menopausal state   . Dyspareunia     Past Surgical History  Procedure Laterality Date  . Lumbosacral spine fusion      L1 compression fracture, fusion at T11-L3  . Orif ankle fracture      Bilateral  . Tendon repair Left     Left forearm  . Back surgery    . Hysteroscopy with novasure  2005  . Tubal ligation    . Cosmetic surgery      abd  . Cesarean section    . Carpal tunnel release      Current Outpatient Prescriptions on File Prior to Visit  Medication Sig Dispense Refill  . calcium-vitamin D (OSCAL WITH D) 250-125 MG-UNIT per tablet Take 1 tablet by mouth daily.    . celecoxib (CELEBREX) 200 MG capsule Take 200 mg by mouth daily.    . DULoxetine (CYMBALTA) 30 MG capsule Take 30 mg by mouth at bedtime.    Marland Kitchen levothyroxine (SYNTHROID, LEVOTHROID) 50 MCG tablet  Take 50 mcg by mouth daily before breakfast.    . methocarbamol (ROBAXIN) 750 MG tablet Take 750 mg by mouth 4 (four) times daily.    . Multiple Vitamins-Minerals (MULTIVITAMIN WITH MINERALS) tablet Take 1 tablet by mouth daily.    . Oxycodone HCl 10 MG TABS Take 10 mg by mouth every 6 (six) hours as needed.    . valACYclovir (VALTREX) 1000 MG tablet Take 1,000 mg by mouth as needed.     No current facility-administered medications on file prior to visit.    Allergies  Allergen Reactions  . Demerol [Meperidine]   . Percogesic [Diphenhydramine-Acetaminophen]   . Tetracyclines & Related   . Other Rash    Skin rash  . Povidone Iodine Rash    Skin rash    Social History   Social History  . Marital Status: Married    Spouse Name: Elta Guadeloupe  . Number of Children: 1  . Years of Education: College gr   Occupational History  .  part time/ self -employed    Social History Main Topics  . Smoking status: Never Smoker   . Smokeless tobacco: Never Used  . Alcohol Use: Yes     Comment: rarely  . Drug Use: No  . Sexual Activity: Yes    Birth Control/ Protection: Surgical   Other Topics Concern  . Not on file   Social History Narrative   Patient lives at home with family.   Caffeine Use: 1 cup in am ;  1 cup of tea in afternoon    Family History  Problem Relation Age of Onset  . Heart failure Mother   . Hypertension Mother   . Diabetes Mother   . Prostate cancer Father   . Heart Problems Father   . Heart attack Brother   . Cancer Neg Hx     The following portions of the patient's history were reviewed and updated as appropriate: allergies, current medications, past family history, past medical history, past social history, past surgical history and problem list.  Review of Systems ROS Review of Systems - General ROS: negative for - chills, fatigue, fever, hot flashes, night sweats, weight gain or weight loss Psychological ROS: negative for - anxiety, decreased libido,  depression, mood swings, physical abuse or sexual abuse Ophthalmic ROS: negative for - blurry vision, eye pain or loss of vision ENT ROS: negative for - headaches, hearing change, visual changes or vocal changes Allergy and Immunology ROS: negative for - hives, itchy/watery eyes or seasonal allergies Hematological and Lymphatic ROS: negative for - bleeding problems, bruising, swollen lymph nodes or weight loss Endocrine ROS: negative for - galactorrhea, hair pattern changes, hot flashes, malaise/lethargy, mood swings, palpitations, polydipsia/polyuria, skin changes, temperature intolerance or unexpected weight changes Breast ROS: negative for - new or changing breast lumps or nipple discharge Respiratory ROS: negative for - cough or shortness of breath Cardiovascular ROS: negative for - chest pain, irregular heartbeat, palpitations or shortness of breath Gastrointestinal ROS: no abdominal pain, change in bowel habits, or black or bloody stools Genito-Urinary ROS: no dysuria, trouble voiding, or hematuria Musculoskeletal ROS: negative for - joint pain or joint stiffness Neurological ROS: negative for - bowel and bladder control changes Dermatological ROS: negative for rash and skin lesion changes   Objective:   BP 99/62 mmHg  Pulse 57  Ht 5\' 5"  (1.651 m)  Wt 138 lb 3.2 oz (62.687 kg)  BMI 23.00 kg/m2 CONSTITUTIONAL: Well-developed, well-nourished female in no acute distress.  PSYCHIATRIC: Normal mood and affect. Normal behavior. Normal judgment and thought content. [place]: Alert and oriented to person, place, and time. Normal muscle tone coordination. No cranial nerve deficit noted. HENT:  Normocephalic, atraumatic, External right and left ear normal. Oropharynx is clear and moist EYES: Conjunctivae and EOM are normal. Pupils are equal, round, and reactive to light. No scleral icterus.  NECK: Normal range of motion, supple, no masses.  Normal thyroid.  SKIN: Skin is warm and dry. No  rash noted. Not diaphoretic. No erythema. No pallor. CARDIOVASCULAR: Normal heart rate noted, regular rhythm, no murmur. RESPIRATORY: Clear to auscultation bilaterally. Effort and breath sounds normal, no problems with respiration noted. BREASTS: Symmetric in size. No masses, skin changes, nipple drainage, or lymphadenopathy. ABDOMEN: Soft, normal bowel sounds, no distention noted.  No tenderness, rebound or guarding.  BLADDER: Normal PELVIC:  External Genitalia: Normal  BUS: Normal  Vagina: Mild Atrophic changes  Cervix: Normal  Uterus: Normal  Adnexa: Normal  RV: External Exam NormaI, No Rectal Masses and Normal  Sphincter tone  MUSCULOSKELETAL: Normal range of motion. No tenderness.  No cyanosis, clubbing, or edema.  2+ distal pulses. LYMPHATIC: No Axillary, Supraclavicular, or Inguinal Adenopathy.    Assessment:   Annual gynecologic examination 59 y.o. Contraception: post menopausal status Normal BMI Problem List Items Addressed This Visit    None      Plan:  Pap: Not needed Mammogram: Ordered Stool Guaiac Testing:  Not Indicated Labs: thru pcp Routine preventative health maintenance measures emphasized: Exercise/Diet/Weight control, Tobacco Warnings and Alcohol/Substance use risks Return to [place], CMA  Brayton Mars, MD

## 2016-02-16 ENCOUNTER — Encounter: Payer: Self-pay | Admitting: Obstetrics and Gynecology

## 2016-02-16 ENCOUNTER — Ambulatory Visit (INDEPENDENT_AMBULATORY_CARE_PROVIDER_SITE_OTHER): Payer: PRIVATE HEALTH INSURANCE | Admitting: Obstetrics and Gynecology

## 2016-02-16 VITALS — BP 110/71 | HR 57 | Ht 65.0 in | Wt 143.7 lb

## 2016-02-16 DIAGNOSIS — N952 Postmenopausal atrophic vaginitis: Secondary | ICD-10-CM

## 2016-02-16 DIAGNOSIS — Z780 Asymptomatic menopausal state: Secondary | ICD-10-CM | POA: Diagnosis not present

## 2016-02-16 DIAGNOSIS — N950 Postmenopausal bleeding: Secondary | ICD-10-CM

## 2016-02-16 NOTE — Progress Notes (Signed)
GYN ENCOUNTER NOTE  Subjective:       [name] is a 60 y.o. G39P1020 female is here for gynecologic evaluation of the following issues:  1. Post-menopausal bleeding 2. Vaginal atrophy  Patient presents with recent history of 3 episodes mild bright red vaginal spotting.  She used estrogen cream for 1 year prior to stopping 5 months ago due to weight gain.   Gynecologic History No LMP recorded. Patient has had an ablation. Contraception: post menopausal status Last Pap: 07/2014. Results were: normal Last mammogram: 08/2014. Results were: normal  Obstetric History OB History  Gravida Para Term Preterm AB SAB TAB Ectopic Multiple Living  3 1 1  2          # Outcome Date GA Lbr Len/2nd Weight Sex Delivery Anes PTL Lv  3 AB           2 AB           1 Term      CS-LTranv         Past Medical History  Diagnosis Date  . Abnormality of gait 11/27/2013  . Hypothyroid   . Irritable bowel   . Concussion   . History of subdural hematoma (post traumatic)     Right temporal  . Arthritis     Left shoulder  . Skin cancer   . Chronic back pain     and neck pain  . Skull fracture ([employer])   . Environmental allergies   . Herpes   . Menopausal state   . Dyspareunia     Past Surgical History  Procedure Laterality Date  . Lumbosacral spine fusion      L1 compression fracture, fusion at T11-L3  . Orif ankle fracture      Bilateral  . Tendon repair Left     Left forearm  . Back surgery    . Hysteroscopy with novasure  2005  . Tubal ligation    . Cosmetic surgery      abd  . Cesarean section    . Carpal tunnel release      Current Outpatient Prescriptions on File Prior to Visit  Medication Sig Dispense Refill  . calcium-vitamin D (OSCAL WITH D) 250-125 MG-UNIT per tablet Take 1 tablet by mouth daily.    . celecoxib (CELEBREX) 200 MG capsule Take 200 mg by mouth daily.    Marland Kitchen levothyroxine (SYNTHROID, LEVOTHROID) 50 MCG tablet Take 50 mcg by mouth daily before breakfast.     . methocarbamol (ROBAXIN) 750 MG tablet Take 750 mg by mouth 4 (four) times daily.    . Multiple Vitamins-Minerals (MULTIVITAMIN WITH MINERALS) tablet Take 1 tablet by mouth daily.    . Oxycodone HCl 10 MG TABS Take 10 mg by mouth every 6 (six) hours as needed.    . valACYclovir (VALTREX) 1000 MG tablet Take 1,000 mg by mouth as needed.     No current facility-administered medications on file prior to visit.    Allergies  Allergen Reactions  . Demerol [Meperidine]   . Percogesic [Diphenhydramine-Acetaminophen]   . Tetracyclines & Related   . Other Rash    Skin rash  . Povidone Iodine Rash    Skin rash    Social History   Social History  . Marital Status: Married    Spouse Name: Elta Guadeloupe  . Number of Children: 1  . Years of Education: College gr   Occupational History  .      part time/ self -employed  Social History Main Topics  . Smoking status: Never Smoker   . Smokeless tobacco: Never Used  . Alcohol Use: Yes     Comment: rarely  . Drug Use: No  . Sexual Activity: Yes    Birth Control/ Protection: Surgical   Other Topics Concern  . Not on file   Social History Narrative   Patient lives at home with family.   Caffeine Use: 1 cup in am ;  1 cup of tea in afternoon    Family History  Problem Relation Age of Onset  . Heart failure Mother   . Hypertension Mother   . Diabetes Mother   . Prostate cancer Father   . Heart Problems Father   . Heart attack Brother   . Cancer Neg Hx     The following portions of the patient's history were reviewed and updated as appropriate: allergies, current medications, past family history, past medical history, past social history, past surgical history and problem list.  Review of Systems Review of Systems - General ROS: negative for - chills, fatigue, fever, hot flashes, malaise or night sweats Hematological and Lymphatic ROS: negative for - bleeding problems or swollen lymph nodes Gastrointestinal ROS: negative for -  abdominal pain, blood in stools, change in bowel habits and nausea/vomiting Musculoskeletal ROS: negative for - joint pain, muscle pain or muscular weakness Genito-Urinary ROS: negative for - change in menstrual cycle, dysmenorrhea, dyspareunia, dysuria, genital discharge, genital ulcers, hematuria, incontinence, irregular/heavy menses, nocturia or pelvic pain.  Positive for spotting.  Objective:   BP 110/71 mmHg  Pulse 57  Ht 5\' 5"  (1.651 m)  Wt 143 lb 11.2 oz (65.182 kg)  BMI 23.91 kg/m2 CONSTITUTIONAL: Well-developed, well-nourished female in no acute distress.  ABDOMEN: Soft, non distended; Non tender.  No Organomegaly. PELVIC:  External Genitalia: Normal  BUS: Normal  Vagina: moderate atrophy, potential petechiae on vaginal walls; decreased rugae; pale mucosa   Cervix: Normal  Uterus: Normal size, shape, consistency, mobile, midplane  Adnexa: Normal  RV: Normal   Bladder: Nontender  Regular physical exam deferred.     Assessment:   1. Menopause: infrequent hot sweats  2. PMB (postmenopausal bleeding) - US Pelvis Complete; Future - US Transvaginal Non-OB; Future  3. Vaginal atrophy     Plan:   Schedule ultrasound within 1 week; follow with probable endometrial biopsy and management Restart use of estrogen cream Premarin, 0.5 g daily after evaluation is complete  A total of 15 minutes were spent face-to-face with the patient during this encounter and over half of that time dealt with counseling and coordination of care.   Olene Floss, PA-S Brayton Mars, MD   I have seen, interviewed, and examined the patient in conjunction with the [facility].A. student and affirm the diagnosis and management plan. Naria Abbey A. Kailia Starry, MD, FACOG   Note: This dictation was prepared with Dragon dictation along with smaller phrase technology. Any transcriptional errors that result from this process are unintentional.

## 2016-02-16 NOTE — Patient Instructions (Signed)
1. Pelvic ultrasound is scheduled to assess postmenopausal bleeding 2. Return in 1 week after ultrasound for possible endometrial biopsy and further management planning

## 2016-02-23 ENCOUNTER — Ambulatory Visit (INDEPENDENT_AMBULATORY_CARE_PROVIDER_SITE_OTHER): Payer: PRIVATE HEALTH INSURANCE

## 2016-02-23 DIAGNOSIS — N950 Postmenopausal bleeding: Secondary | ICD-10-CM | POA: Diagnosis not present

## 2016-02-28 ENCOUNTER — Ambulatory Visit (INDEPENDENT_AMBULATORY_CARE_PROVIDER_SITE_OTHER): Payer: PRIVATE HEALTH INSURANCE | Admitting: Obstetrics and Gynecology

## 2016-02-28 ENCOUNTER — Encounter: Payer: Self-pay | Admitting: Obstetrics and Gynecology

## 2016-02-28 VITALS — BP 105/63 | HR 56 | Ht 65.0 in | Wt 142.9 lb

## 2016-02-28 DIAGNOSIS — R1909 Other intra-abdominal and pelvic swelling, mass and lump: Secondary | ICD-10-CM | POA: Diagnosis not present

## 2016-02-28 DIAGNOSIS — N882 Stricture and stenosis of cervix uteri: Secondary | ICD-10-CM

## 2016-02-28 DIAGNOSIS — N950 Postmenopausal bleeding: Secondary | ICD-10-CM

## 2016-02-28 DIAGNOSIS — N952 Postmenopausal atrophic vaginitis: Secondary | ICD-10-CM

## 2016-02-28 DIAGNOSIS — N888 Other specified noninflammatory disorders of cervix uteri: Secondary | ICD-10-CM | POA: Insufficient documentation

## 2016-02-28 NOTE — Patient Instructions (Signed)
PLAN: 1. Repeat ultrasound in 2 months to confirm stability 2. Monitor for any postmenopausal bleeding; return for immediate evaluation if this recurs 3. Return in 2 months for follow-up. We will likely restart estrogen cream intravaginal for management of atrophy once stability of findings is confirmed.

## 2016-02-28 NOTE — Progress Notes (Signed)
Chief complaint: 1. Postmenopausal bleeding 2. Follow-up after pelvic ultrasound  Patient presents for follow-up. Ultrasound demonstrated a thin endometrial stripe measuring 2.9 mm. Within the endocervix is a 3 cm hypoechoic hypervascular mass.  History of NovaSure endometrial ablation in 2005 History of estrogen cream therapy for 1 year until the episodes of bright red bleeding recently No history of abnormal Pap smears  OBJECTIVE: BP 105/63 mmHg  Pulse 56  Ht 5\' 5"  (1.651 m)  Wt 142 lb 14.4 oz (64.819 kg)  BMI 23.78 kg/m2  CONSTITUTIONAL: Well-developed, well-nourished female in no acute distress.  ABDOMEN: Soft, non distended; Non tender. No Organomegaly. PELVIC: External Genitalia: Normal BUS: Normal Vagina: moderate atrophy, potential petechiae on vaginal walls; decreased rugae; pale mucosa  Cervix: Normal; stenotic Uterus: Normal size, shape, consistency, mobile, midplane Adnexa: Normal RV: Normal  Bladder: Nontender  PROCEDURE: Endocervical curettage Rectangular basket unable to be passed through endocervical canal; dilation with lacrimal duct probes yielded significant resistance due to stenosis. Procedure was terminated. No sampling was obtained.  ASSESSMENT: 1. 3 episodes of postmenopausal bleeding, bright red, found in association with uterine use of estrogen cream 2. History of NovaSure endometrial ablation 3. Pelvic ultrasound demonstrates a thin endometrial stripe, 2.9 mm 4. 3 cm hypoechoic, hypervascular mass in the endocervix; question of nabothian cyst versus endocervical polyp 5. Attempted ECC unsuccessful due to cervical stenosis  PLAN: 1. Repeat ultrasound in 2 months to confirm stability 2. Monitor for any postmenopausal bleeding; return for immediate evaluation if this recurs 3. Return in 2 months for follow-up. We will likely restart estrogen cream  intravaginal for management of atrophy once stability of findings is confirmed.  A total of 15 minutes were spent face-to-face with the patient during this encounter and over half of that time dealt with counseling and coordination of care.  Brayton Mars, MD  Note: This dictation was prepared with Dragon dictation along with smaller phrase technology. Any transcriptional errors that result from this process are unintentional.

## 2016-04-19 ENCOUNTER — Ambulatory Visit: Payer: PRIVATE HEALTH INSURANCE | Admitting: Obstetrics and Gynecology

## 2016-04-24 ENCOUNTER — Ambulatory Visit (INDEPENDENT_AMBULATORY_CARE_PROVIDER_SITE_OTHER): Payer: PRIVATE HEALTH INSURANCE

## 2016-04-24 DIAGNOSIS — R1909 Other intra-abdominal and pelvic swelling, mass and lump: Secondary | ICD-10-CM | POA: Diagnosis not present

## 2016-04-24 DIAGNOSIS — N950 Postmenopausal bleeding: Secondary | ICD-10-CM | POA: Diagnosis not present

## 2016-04-24 DIAGNOSIS — N888 Other specified noninflammatory disorders of cervix uteri: Secondary | ICD-10-CM

## 2016-04-25 ENCOUNTER — Encounter: Payer: Self-pay | Admitting: Obstetrics and Gynecology

## 2016-04-25 ENCOUNTER — Ambulatory Visit (INDEPENDENT_AMBULATORY_CARE_PROVIDER_SITE_OTHER): Payer: PRIVATE HEALTH INSURANCE | Admitting: Obstetrics and Gynecology

## 2016-04-25 VITALS — BP 121/74 | HR 64 | Ht 65.0 in | Wt 143.0 lb

## 2016-04-25 DIAGNOSIS — N952 Postmenopausal atrophic vaginitis: Secondary | ICD-10-CM | POA: Diagnosis not present

## 2016-04-25 DIAGNOSIS — N888 Other specified noninflammatory disorders of cervix uteri: Secondary | ICD-10-CM

## 2016-04-25 DIAGNOSIS — R1909 Other intra-abdominal and pelvic swelling, mass and lump: Secondary | ICD-10-CM | POA: Diagnosis not present

## 2016-04-25 DIAGNOSIS — N882 Stricture and stenosis of cervix uteri: Secondary | ICD-10-CM | POA: Diagnosis not present

## 2016-04-25 DIAGNOSIS — Z780 Asymptomatic menopausal state: Secondary | ICD-10-CM

## 2016-04-25 DIAGNOSIS — N950 Postmenopausal bleeding: Secondary | ICD-10-CM

## 2016-04-25 NOTE — Progress Notes (Signed)
Chief complaint: 1. Follow-up on ultrasound 2. Hypoechoic hypervascular cervical mass 3. History of postmenopausal bleeding 4. Vaginal atrophy  Review of ultrasound demonstrates stability of endocervical mass findings. Hypoechoic hypervascular lesion is unchanged. Endometrial stripe is thin measuring 2.2 mm. The patient has not experienced any further postmenopausal bleeding since her last evaluation.  Patient requests Pap smear. She has no history of abnormal Pap smears. She has not had any new partners since long-term marriage began.  OBJECTIVE: BP 121/74 mmHg  Pulse 64  Ht 5\' 5"  (1.651 m)  Wt 143 lb (64.864 kg)  BMI 23.80 kg/m2 Physical exam deferred  ASSESSMENT: 1. History of postmenopausal bleeding, isolated, non-recurrence, inpatient status post endometrial ablation 2. Hypoechoic hypervascular cervical mass, unchanged on serial ultrasounds 3. Cervical stenosis precluding ECC 4. Vaginal atrophy, symptomatic 5. Thin endometrial stripe on serial ultrasounds, not requiring biopsy  PLAN: 1. Resume Premarin cream 1/2 g intravaginal twice a week for vaginal atrophy 2. Return in 3 months for follow-up and Pap smear (patient requested)  A total of 15 minutes were spent face-to-face with the patient during this encounter and over half of that time dealt with counseling and coordination of care.  Brayton Mars, MD  Note: This dictation was prepared with Dragon dictation along with smaller phrase technology. Any transcriptional errors that result from this process are unintentional.

## 2016-04-25 NOTE — Patient Instructions (Signed)
1. Restart Premarin cream intravaginal 1/2 g twice a week 2. Return in 3 months for follow-up vaginal atrophy and postmenopausal bleeding 3. No need for further investigation due to stability of findings on ultrasound

## 2016-07-30 NOTE — Progress Notes (Deleted)
ANNUAL PREVENTATIVE CARE GYN  ENCOUNTER NOTE  Subjective:       [name] is a 60 y.o. G62P1020 female here for a routine annual gynecologic exam.  Current complaints: 1.      Gynecologic History No LMP recorded. Patient has had an ablation. Contraception: post menopausal status Last Pap: 07/2014 neg/neg. Results were: normal Last mammogram: 08/2014 birad 1. Results were: normal  Obstetric History OB History  Gravida Para Term Preterm AB Living  3 1 1   2     SAB TAB Ectopic Multiple Live Births               # Outcome Date GA Lbr Len/2nd Weight Sex Delivery Anes PTL Lv  3 AB           2 AB           1 Term      CS-LTranv         Past Medical History:  Diagnosis Date  . Abnormality of gait 11/27/2013  . Arthritis    Left shoulder  . Chronic back pain    and neck pain  . Concussion   . Dyspareunia   . Environmental allergies   . Herpes   . History of subdural hematoma (post traumatic)    Right temporal  . Hypothyroid   . Irritable bowel   . Menopausal state   . Skin cancer   . Skull fracture [facility])     Past Surgical History:  Procedure Laterality Date  . BACK SURGERY    . CARPAL TUNNEL RELEASE    . CESAREAN SECTION    . COSMETIC SURGERY     abd  . HYSTEROSCOPY WITH NOVASURE  2005  . lumbosacral spine fusion     L1 compression fracture, fusion at T11-L3  . ORIF ANKLE FRACTURE     Bilateral  . TENDON REPAIR Left    Left forearm  . TUBAL LIGATION      Current Outpatient Prescriptions on File Prior to Visit  Medication Sig Dispense Refill  . calcium-vitamin D (OSCAL WITH D) 250-125 MG-UNIT per tablet Take 1 tablet by mouth daily.    . celecoxib (CELEBREX) 200 MG capsule Take 200 mg by mouth daily.    . DULoxetine (CYMBALTA) 20 MG capsule     . levothyroxine (SYNTHROID, LEVOTHROID) 50 MCG tablet Take 50 mcg by mouth daily before breakfast.    . methocarbamol (ROBAXIN) 750 MG tablet Take 750 mg by mouth 4 (four) times daily.    Marland Kitchen MOVANTIK 25 MG TABS  tablet     . Multiple Vitamins-Minerals (MULTIVITAMIN WITH MINERALS) tablet Take 1 tablet by mouth daily.    . Oxycodone HCl 10 MG TABS Take 10 mg by mouth every 6 (six) hours as needed.    . valACYclovir (VALTREX) 1000 MG tablet Take 1,000 mg by mouth as needed.     No current facility-administered medications on file prior to visit.     Allergies  Allergen Reactions  . Demerol [Meperidine]   . Percogesic [Diphenhydramine-Acetaminophen]   . Tetracyclines & Related   . Other Rash    Skin rash  . Povidone Iodine Rash    Skin rash    Social History   Social History  . Marital status: Married    Spouse name: Elta Guadeloupe  . Number of children: 1  . Years of education: College gr   Occupational History  .      part time/ self -employed  Social History Main Topics  . Smoking status: Never Smoker  . Smokeless tobacco: Never Used  . Alcohol use Yes     Comment: rarely  . Drug use: No  . Sexual activity: Yes    Birth control/ protection: Surgical   Other Topics Concern  . Not on file   Social History Narrative   Patient lives at home with family.   Caffeine Use: 1 cup in am ;  1 cup of tea in afternoon    Family History  Problem Relation Age of Onset  . Heart failure Mother   . Hypertension Mother   . Diabetes Mother   . Prostate cancer Father   . Heart Problems Father   . Heart attack Brother   . Cancer Neg Hx     The following portions of the patient's history were reviewed and updated as appropriate: allergies, current medications, past family history, past medical history, past social history, past surgical history and problem list.  Review of Systems ROS Review of Systems - General ROS: negative for - chills, fatigue, fever, hot flashes, night sweats, weight gain or weight loss Psychological ROS: negative for - anxiety, decreased libido, depression, mood swings, physical abuse or sexual abuse Ophthalmic ROS: negative for - blurry vision, eye pain or loss of  vision ENT ROS: negative for - headaches, hearing change, visual changes or vocal changes Allergy and Immunology ROS: negative for - hives, itchy/watery eyes or seasonal allergies Hematological and Lymphatic ROS: negative for - bleeding problems, bruising, swollen lymph nodes or weight loss Endocrine ROS: negative for - galactorrhea, hair pattern changes, hot flashes, malaise/lethargy, mood swings, palpitations, polydipsia/polyuria, skin changes, temperature intolerance or unexpected weight changes Breast ROS: negative for - new or changing breast lumps or nipple discharge Respiratory ROS: negative for - cough or shortness of breath Cardiovascular ROS: negative for - chest pain, irregular heartbeat, palpitations or shortness of breath Gastrointestinal ROS: no abdominal pain, change in bowel habits, or black or bloody stools Genito-Urinary ROS: no dysuria, trouble voiding, or hematuria Musculoskeletal ROS: negative for - joint pain or joint stiffness Neurological ROS: negative for - bowel and bladder control changes Dermatological ROS: negative for rash and skin lesion changes   Objective:   There were no vitals taken for this visit. CONSTITUTIONAL: Well-developed, well-nourished female in no acute distress.  PSYCHIATRIC: Normal mood and affect. Normal behavior. Normal judgment and thought content. [place]: Alert and oriented to person, place, and time. Normal muscle tone coordination. No cranial nerve deficit noted. HENT:  Normocephalic, atraumatic, External right and left ear normal. Oropharynx is clear and moist EYES: Conjunctivae and EOM are normal. Pupils are equal, round, and reactive to light. No scleral icterus.  NECK: Normal range of motion, supple, no masses.  Normal thyroid.  SKIN: Skin is warm and dry. No rash noted. Not diaphoretic. No erythema. No pallor. CARDIOVASCULAR: Normal heart rate noted, regular rhythm, no murmur. RESPIRATORY: Clear to auscultation bilaterally. Effort  and breath sounds normal, no problems with respiration noted. BREASTS: Symmetric in size. No masses, skin changes, nipple drainage, or lymphadenopathy. ABDOMEN: Soft, normal bowel sounds, no distention noted.  No tenderness, rebound or guarding.  BLADDER: Normal PELVIC:  External Genitalia: Normal  BUS: Normal  Vagina: Normal  Cervix: Normal  Uterus: Normal  Adnexa: Normal  RV: {Blank multiple:19196::"External Exam NormaI","No Rectal Masses","Normal Sphincter tone"}  MUSCULOSKELETAL: Normal range of motion. No tenderness.  No cyanosis, clubbing, or edema.  2+ distal pulses. LYMPHATIC: No Axillary, Supraclavicular, or Inguinal Adenopathy.  Assessment:   Annual gynecologic examination 60 y.o. Contraception: post menopausal status Normal BMI Problem List Items Addressed This Visit    HSV-2 seropositive   Menopause    Other Visit Diagnoses    Well woman exam    -  Primary   Screening for breast cancer       Vaginal atrophy          Plan:  Pap: due 2018 Mammogram: Ordered Stool Guaiac Testing:  ? Labs: thru pcp Routine preventative health maintenance measures emphasized: {Blank multiple:19196::"Exercise/Diet/Weight control","Tobacco Warnings","Alcohol/Substance use risks","Stress Management","Peer Pressure Issues","Safe Sex"} *** Return to [place] [facility] [place], [place]

## 2016-08-01 ENCOUNTER — Encounter: Payer: PRIVATE HEALTH INSURANCE | Admitting: Obstetrics and Gynecology

## 2016-08-20 ENCOUNTER — Other Ambulatory Visit: Payer: Self-pay | Admitting: Internal Medicine

## 2016-08-20 ENCOUNTER — Ambulatory Visit
Admission: RE | Admit: 2016-08-20 | Discharge: 2016-08-20 | Disposition: A | Discharge status: Home / Self Care | Payer: Managed Care, Other (non HMO) | Source: Ambulatory Visit | Attending: Internal Medicine | Admitting: Internal Medicine

## 2016-08-20 DIAGNOSIS — Z1231 Encounter for screening mammogram for malignant neoplasm of breast: Secondary | ICD-10-CM

## 2016-10-31 ENCOUNTER — Encounter
Admission: RE | Admit: 2016-10-31 | Discharge: 2016-10-31 | Disposition: A | Discharge status: Home / Self Care | Payer: Managed Care, Other (non HMO) | Source: Ambulatory Visit | Attending: Obstetrics and Gynecology | Admitting: Obstetrics and Gynecology

## 2016-10-31 DIAGNOSIS — Z01818 Encounter for other preprocedural examination: Secondary | ICD-10-CM | POA: Diagnosis present

## 2016-10-31 LAB — CBC
HCT: 40.3 % (ref 35.0–47.0)
HEMOGLOBIN: 13.6 g/dL (ref 12.0–16.0)
MCH: 30.8 pg (ref 26.0–34.0)
MCHC: 33.7 g/dL (ref 32.0–36.0)
MCV: 91.3 fL (ref 80.0–100.0)
PLATELETS: 225 10*3/uL (ref 150–440)
RBC: 4.41 MIL/uL (ref 3.80–5.20)
RDW: 13.8 % (ref 11.5–14.5)
WBC: 4.5 10*3/uL (ref 3.6–11.0)

## 2016-10-31 LAB — BASIC METABOLIC PANEL
ANION GAP: 6 (ref 5–15)
BUN: 16 mg/dL (ref 6–20)
CALCIUM: 9.5 mg/dL (ref 8.9–10.3)
CO2: 31 mmol/L (ref 22–32)
CREATININE: 0.7 mg/dL (ref 0.44–1.00)
Chloride: 104 mmol/L (ref 101–111)
Glucose, Bld: 91 mg/dL (ref 65–99)
Potassium: 4.2 mmol/L (ref 3.5–5.1)
SODIUM: 141 mmol/L (ref 135–145)

## 2016-10-31 LAB — TYPE AND SCREEN
ABO/RH(D): O POS
ANTIBODY SCREEN: NEGATIVE
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
## 2016-10-31 NOTE — Patient Instructions (Signed)
  Your procedure is scheduled on: Friday, November 09, 2016   Report to [facility]  To find out your arrival time please call [phone] between 1PM - 3PM on Thursday, December 7th, 2017  Remember: Instructions that are not followed completely may result in serious medical risk, up to and including death, or upon the discretion of your surgeon and anesthesiologist your surgery may need to be rescheduled.    __X__ 1. Do not eat food or drink liquids after midnight. No gum chewing or hard candies.     ____ 2. No Alcohol for 24 hours before or after surgery.   ____ 3. Do Not Smoke For 24 Hours Prior to Your Surgery.   ____ 4. Bring all medications with you on the day of surgery if instructed.    __X__ 5. Notify your doctor if there is any change in your medical condition     (cold, fever, infections).       Do not wear jewelry, make-up, hairpins, clips or nail polish.   Do not wear lotions, powders, or perfumes. You may wear deodorant.   Do not shave 48 hours prior to surgery. Men may shave face and neck.   Do not bring valuables to the hospital.     [facility] is not responsible for any belongings or valuables.               Contacts, dentures or bridgework may not be worn into surgery.  Leave your suitcase in the car. After surgery it may be brought to your room.  For patients admitted to the hospital, discharge time is determined by your                treatment team.   Patients discharged the day of surgery will not be allowed to drive home.   Please read over the following fact sheets that you were given:   Fact Sheet and OR information Sheet  __X__ Take these medicines the morning of surgery with A SIP OF WATER:    1. Celebrex  2. Cymbalta  3. Synthroid  4. Oxycodone  5.  6.  ____ Fleet Enema (as directed)   ____ Use CHG Soap as directed  ____ Use inhalers on the day of surgery  ____ Stop metformin 2 days prior to surgery    ____ Take 1/2  of usual insulin dose the night before surgery and none on the morning of surgery.   ____ Stop Coumadin/Plavix/aspirin on ...does not take  __x__ Stop Anti-inflammatories on ... does not take. YOU MAY TAKE TYLENOL IF NEEDED    __x__ Stop supplements until after surgery.  DO NOT TAKE MULTIVITS, ROBAXIN,LAMISIL.VALTREX ON DAY OF SURGERY  ____ Bring C-Pap to the hospital.

## 2016-11-09 ENCOUNTER — Encounter
Admission: RE | Disposition: A | Discharge status: Home / Self Care | Payer: Self-pay | Source: Ambulatory Visit | Attending: Obstetrics and Gynecology

## 2016-11-09 ENCOUNTER — Ambulatory Visit: Payer: Managed Care, Other (non HMO) | Admitting: Anesthesiology

## 2016-11-09 ENCOUNTER — Ambulatory Visit
Admission: RE | Admit: 2016-11-09 | Discharge: 2016-11-09 | Disposition: A | Discharge status: Home / Self Care | Payer: Managed Care, Other (non HMO) | Source: Ambulatory Visit | Attending: Obstetrics and Gynecology | Admitting: Obstetrics and Gynecology

## 2016-11-09 DIAGNOSIS — M1990 Unspecified osteoarthritis, unspecified site: Secondary | ICD-10-CM | POA: Insufficient documentation

## 2016-11-09 DIAGNOSIS — Y763 Surgical instruments, materials and obstetric and gynecological devices (including sutures) associated with adverse incidents: Secondary | ICD-10-CM | POA: Insufficient documentation

## 2016-11-09 DIAGNOSIS — N895 Stricture and atresia of vagina: Secondary | ICD-10-CM | POA: Diagnosis not present

## 2016-11-09 DIAGNOSIS — N9971 Accidental puncture and laceration of a genitourinary system organ or structure during a genitourinary system procedure: Secondary | ICD-10-CM | POA: Insufficient documentation

## 2016-11-09 DIAGNOSIS — Z888 Allergy status to other drugs, medicaments and biological substances status: Secondary | ICD-10-CM | POA: Diagnosis not present

## 2016-11-09 DIAGNOSIS — Y838 Other surgical procedures as the cause of abnormal reaction of the patient, or of later complication, without mention of misadventure at the time of the procedure: Secondary | ICD-10-CM | POA: Diagnosis not present

## 2016-11-09 DIAGNOSIS — E039 Hypothyroidism, unspecified: Secondary | ICD-10-CM | POA: Diagnosis not present

## 2016-11-09 DIAGNOSIS — Z881 Allergy status to other antibiotic agents status: Secondary | ICD-10-CM | POA: Insufficient documentation

## 2016-11-09 DIAGNOSIS — N882 Stricture and stenosis of cervix uteri: Secondary | ICD-10-CM | POA: Insufficient documentation

## 2016-11-09 DIAGNOSIS — Z885 Allergy status to narcotic agent status: Secondary | ICD-10-CM | POA: Diagnosis not present

## 2016-11-09 DIAGNOSIS — G8929 Other chronic pain: Secondary | ICD-10-CM | POA: Diagnosis not present

## 2016-11-09 DIAGNOSIS — N950 Postmenopausal bleeding: Secondary | ICD-10-CM | POA: Diagnosis present

## 2016-11-09 DIAGNOSIS — Z85828 Personal history of other malignant neoplasm of skin: Secondary | ICD-10-CM | POA: Diagnosis not present

## 2016-11-09 DIAGNOSIS — K589 Irritable bowel syndrome without diarrhea: Secondary | ICD-10-CM | POA: Diagnosis not present

## 2016-11-09 DIAGNOSIS — Z91041 Radiographic dye allergy status: Secondary | ICD-10-CM | POA: Insufficient documentation

## 2016-11-09 DIAGNOSIS — Z886 Allergy status to analgesic agent status: Secondary | ICD-10-CM | POA: Diagnosis not present

## 2016-11-09 HISTORY — PX: LAPAROSCOPY: SHX197

## 2016-11-09 HISTORY — PX: HYSTEROSCOPY WITH D & C: SHX1775

## 2016-11-09 LAB — ABO/RH: ABO/RH(D): O POS

## 2016-11-09 SURGERY — DILATATION AND CURETTAGE /HYSTEROSCOPY
Anesthesia: General | Site: Uterus | Wound class: Clean Contaminated

## 2016-11-09 MED ORDER — BUPIVACAINE HCL (PF) 0.5 % IJ SOLN
INTRAMUSCULAR | Status: AC
  Filled 2016-11-09: qty 30

## 2016-11-09 MED ORDER — OXYCODONE HCL 5 MG PO TABS
ORAL_TABLET | ORAL | Status: AC
  Filled 2016-11-09: qty 1

## 2016-11-09 MED ORDER — OXYCODONE HCL 5 MG/5ML PO SOLN: 5.0000 mg | Freq: Once | ORAL | Status: AC | PRN

## 2016-11-09 MED ORDER — PROPOFOL 10 MG/ML IV BOLUS
INTRAVENOUS | Status: DC | PRN
  Administered 2016-11-09: 150 mg via INTRAVENOUS
  Administered 2016-11-09: 50 mg via INTRAVENOUS

## 2016-11-09 MED ORDER — SUGAMMADEX SODIUM 200 MG/2ML IV SOLN
INTRAVENOUS | Status: DC | PRN
  Administered 2016-11-09: 120 mg via INTRAVENOUS

## 2016-11-09 MED ORDER — FENTANYL CITRATE (PF) 100 MCG/2ML IJ SOLN
INTRAMUSCULAR | Status: DC | PRN
  Administered 2016-11-09 (×5): 25 ug via INTRAVENOUS

## 2016-11-09 MED ORDER — DOCUSATE SODIUM 100 MG PO CAPS: 100.0000 mg | ORAL_CAPSULE | Freq: Two times a day (BID) | ORAL | 0 refills | Status: DC

## 2016-11-09 MED ORDER — ONDANSETRON HCL 4 MG/2ML IJ SOLN
INTRAMUSCULAR | Status: DC | PRN
  Administered 2016-11-09: 4 mg via INTRAVENOUS

## 2016-11-09 MED ORDER — FENTANYL CITRATE (PF) 100 MCG/2ML IJ SOLN: 25.0000 ug | INTRAMUSCULAR | Status: DC | PRN

## 2016-11-09 MED ORDER — CEFAZOLIN SODIUM 1 G IJ SOLR
INTRAMUSCULAR | Status: DC | PRN
  Administered 2016-11-09: 2 g via INTRAMUSCULAR

## 2016-11-09 MED ORDER — LACTATED RINGERS IV SOLN
INTRAVENOUS | Status: DC
  Administered 2016-11-09: 09:00:00 via INTRAVENOUS

## 2016-11-09 MED ORDER — LIDOCAINE HCL (CARDIAC) 20 MG/ML IV SOLN
INTRAVENOUS | Status: DC | PRN
  Administered 2016-11-09: 60 mg via INTRAVENOUS

## 2016-11-09 MED ORDER — ROCURONIUM BROMIDE 100 MG/10ML IV SOLN
INTRAVENOUS | Status: DC | PRN
  Administered 2016-11-09: 5 mg via INTRAVENOUS
  Administered 2016-11-09: 30 mg via INTRAVENOUS
  Administered 2016-11-09: 5 mg via INTRAVENOUS

## 2016-11-09 MED ORDER — DEXAMETHASONE SODIUM PHOSPHATE 10 MG/ML IJ SOLN
INTRAMUSCULAR | Status: DC | PRN
  Administered 2016-11-09: 4 mg via INTRAVENOUS

## 2016-11-09 MED ORDER — OXYCODONE HCL 5 MG PO TABS
5.0000 mg | ORAL_TABLET | Freq: Once | ORAL | Status: AC | PRN
  Administered 2016-11-09: 5 mg via ORAL

## 2016-11-09 MED ORDER — IBUPROFEN 800 MG PO TABS: 800.0000 mg | ORAL_TABLET | Freq: Three times a day (TID) | ORAL | 1 refills | Status: DC | PRN

## 2016-11-09 MED ORDER — EPHEDRINE SULFATE 50 MG/ML IJ SOLN
INTRAMUSCULAR | Status: DC | PRN
  Administered 2016-11-09 (×2): 5 mg via INTRAVENOUS

## 2016-11-09 MED ORDER — OXYCODONE-ACETAMINOPHEN 5-325 MG PO TABS
1.0000 | ORAL_TABLET | Freq: Four times a day (QID) | ORAL | 0 refills | Status: DC | PRN
Start: 2016-11-09 — End: 2021-11-24

## 2016-11-09 MED ORDER — MIDAZOLAM HCL 2 MG/2ML IJ SOLN
INTRAMUSCULAR | Status: DC | PRN
  Administered 2016-11-09: 2 mg via INTRAVENOUS

## 2016-11-09 MED ORDER — SUCCINYLCHOLINE CHLORIDE 20 MG/ML IJ SOLN
INTRAMUSCULAR | Status: DC | PRN
  Administered 2016-11-09: 80 mg via INTRAVENOUS

## 2016-11-09 MED ORDER — BUPIVACAINE HCL 0.5 % IJ SOLN
INTRAMUSCULAR | Status: DC | PRN
  Administered 2016-11-09: 10 mL

## 2016-11-09 SURGICAL SUPPLY — 32 items
ADH SKN CLS APL DERMABOND .7 (GAUZE/BANDAGES/DRESSINGS) ×2
BAG INFUSER PRESSURE 100CC (MISCELLANEOUS) ×3 IMPLANT
BLADE SURG 15 STRL LF DISP TIS (BLADE) IMPLANT
BLADE SURG 15 STRL SS (BLADE) ×3
CANISTER SUCT 3000ML (MISCELLANEOUS) ×3 IMPLANT
CATH ROBINSON RED A/P 16FR (CATHETERS) ×3 IMPLANT
CORD URO TURP 10FT (MISCELLANEOUS) ×3 IMPLANT
DERMABOND ADVANCED (GAUZE/BANDAGES/DRESSINGS) ×1
DERMABOND ADVANCED .7 DNX12 (GAUZE/BANDAGES/DRESSINGS) IMPLANT
ELECT LOOP MED HF 24F 12D (CUTTING LOOP) ×3 IMPLANT
ELECT REM PT RETURN 9FT ADLT (ELECTROSURGICAL) ×3
ELECT RESECT POWERBALL 24F (MISCELLANEOUS) ×3 IMPLANT
ELECTRODE REM PT RTRN 9FT ADLT (ELECTROSURGICAL) ×2 IMPLANT
GLOVE BIO SURGEON STRL SZ7 (GLOVE) ×3 IMPLANT
GLOVE INDICATOR 7.5 STRL GRN (GLOVE) ×3 IMPLANT
GOWN STRL REUS W/ TWL LRG LVL3 (GOWN DISPOSABLE) ×4 IMPLANT
GOWN STRL REUS W/TWL LRG LVL3 (GOWN DISPOSABLE) ×6
IV LACTATED RINGER IRRG 3000ML (IV SOLUTION) ×3
IV LACTATED RINGERS 1000ML (IV SOLUTION) ×3 IMPLANT
IV LR IRRIG 3000ML ARTHROMATIC (IV SOLUTION) IMPLANT
KIT RM TURNOVER CYSTO AR (KITS) ×3 IMPLANT
NEEDLE HYPO 22GX1.5 SAFETY (NEEDLE) ×1 IMPLANT
PACK DNC HYST (MISCELLANEOUS) ×3 IMPLANT
PAD OB MATERNITY 4.3X12.25 (PERSONAL CARE ITEMS) ×3 IMPLANT
PAD PREP 24X41 OB/GYN DISP (PERSONAL CARE ITEMS) ×3 IMPLANT
SLEEVE ENDOPATH XCEL 5M (ENDOMECHANICALS) ×1 IMPLANT
SUT MNCRL 4-0 (SUTURE) ×3
SUT MNCRL 4-0 27XMFL (SUTURE) ×2
SUT VICRYL 0 AB UR-6 (SUTURE) ×1 IMPLANT
SUTURE MNCRL 4-0 27XMF (SUTURE) IMPLANT
TROCAR XCEL NON-BLD 5MMX100MML (ENDOMECHANICALS) ×1 IMPLANT
TUBING CONNECTING 10 (TUBING) ×3 IMPLANT

## 2016-11-09 NOTE — Anesthesia Postprocedure Evaluation (Addendum)
Anesthesia Post Note  Patient: [name]  Procedure(s) Performed: Procedure(s) (LRB): DILATATION AND CURETTAGE /HYSTEROSCOPY (N/A) LAPAROSCOPY DIAGNOSTIC (N/A)  Patient location during evaluation: PACU Anesthesia Type: General Level of consciousness: awake and alert Pain management: pain level controlled Vital Signs Assessment: post-procedure vital signs reviewed and stable Respiratory status: spontaneous breathing, nonlabored ventilation, respiratory function stable and patient connected to nasal cannula oxygen Cardiovascular status: blood pressure returned to baseline and stable Postop Assessment: no signs of nausea or vomiting Anesthetic complications: no Comments: Called to phase 2 at 1350 because patient was having some mild right sided chest pain.  Hemodynamically stable, no SOB, no nausea, no chest pain anywhere else.  Some increased pain with palpation of the area and movement of the right upper extremity. At this time I feel the etiology of the pain to be musculoskeletal. This was explained to the patient. Instructed that they should call 911, go to the ED or contact a physician if his symptoms do not improve, they worsen, or if they have any other concerns.  Patient voiced and husband understanding.     Last Vitals:  Vitals:   11/09/16 1319 11/09/16 1351  BP: (!) 144/84 126/82  Pulse: 61 72  Resp:  14  Temp:      Last Pain:  Vitals:   11/09/16 1319  TempSrc:   PainSc: 4                  Precious Haws Amauris Debois

## 2016-11-09 NOTE — OR Nursing (Signed)
Dr Amie Critchley notified of patient complaints at bedside to assess patient.

## 2016-11-09 NOTE — Op Note (Addendum)
Operative Report Hysteroscopy with Dilation and Curettage   Indications: Postmenopausal bleeding, cervical stenosis  Pre-operative Diagnosis:  1. Thickened endometrial stripe 2. Cervical stenosis  Post-operative Diagnosis: same.  Procedure: 1. Exam under anesthesia 2. Fractional D&C 3. Hysteroscopy 4. Diagnostic laparoscopy  Surgeon: Benjaman Kindler, MD  Assistant(s):  None  Anesthesia: General endotracheal anesthesia  Anesthesiologist: Andria Frames, MD Anesthesiologist: Andria Frames, MD CRNA: Demetrius Charity, CRNA; Hedda Slade, CRNA  Estimated Blood Loss:  51mL         Intraoperative medications: None         Total IV Fluids: 981ml  Urine Output: 285ml  Total Fluid Deficit:  n/a          Specimens: Endocervical curettings, endometrial curettings         Complications:  Fundal uterine perforation         Disposition: PACU - hemodynamically stable.         Condition: stable  Findings: Significant cervical stenosis, such that uterine sound was unable to be passed. Normal upper abdomen, normal pelvis with some anterior uterine scarring. Bilateral ovaries small and normal. normal cervix, vagina, perineum.   Indication for procedure/Consents: 60 y.o. G3P1020  here for scheduled surgery for the aforementioned diagnoses.   Risks of surgery were discussed with the patient including but not limited to: bleeding which may require transfusion; infection which may require antibiotics; injury to uterus or surrounding organs; intrauterine scarring which may impair future fertility; need for additional procedures including laparotomy or laparoscopy; and other postoperative/anesthesia complications. Written informed consent was obtained.    Procedure Details:  Fractional D&C only  The patient was taken to the operating room where anesthesia was administered and was found to be adequate. After a formal and adequate timeout was performed, she was placed in the dorsal  lithotomy position and examined with the above findings. She was then prepped and draped in the sterile manner. Her bladder was catheterized for an estimated amount of clear, yellow urine. A weighed speculum was then placed in the patient's vagina and a single tooth tenaculum was applied to the anterior lip of the cervix.  An ECC was performed. Her cervix was serially dilated, alternating between gentle cervical dilation and introduction of the hysteroscope to guide the sounding length and cervical direction. Only about 3 cm of dilation was available to beginning of the case before we ran into cervical stenosis. However, using a 13 Hanks anterior perforation was noted, with bowel fat visualized on the hysteroscope. The inside of the uterus was not actually visualized well. The procedure was stopped, and we converted to laparoscopy.2g of Ancef were given at this time.   Attention was turned to the abdomen where an umbilical incision was made with the scalpel.  The Optiview 5-mm trocar and sleeve were then advanced without difficulty with the laparoscope under direct visualization into the abdomen. Opening pressure was 77mmHg. The abdomen was then insufflated with carbon dioxide gas and adequate pneumoperitoneum was obtained.   A detailed survey of the patient's pelvis and abdomen revealed the findings as mentioned above. The uterine perforation was noted to be directly midline fundal. There was minimal bleeding at this site, and by the end of the case there was no bleeding. The hysteroscopic fluid was removed from the peritoneal cavity with manual suction, and revisualization of the uterine perforation revealed a small perforation with no bleeding. There is no damage to the bilateral broad ligaments, anterior bladder, and or on the bowel.  An additional  8mm trocar was placed in the right lower quadrant under direct visualization to assist with gentle bowel retraction out of the pelvis.  Under direct  visualization from above, the surgeon performed sharp curettage in the uterus until there was a gritty texture in all four quadrants. The tenaculum was removed from the anterior lip of the cervix and the vaginal speculum was removed. Vaginally, there was good hemostasis.   Pictures were taken of the quadrants and pelvis. The abdomen was desufflated and all instruments were then removed from the patient's abdomen. All incisions were closed with 4-0 Vicryl and Dermabond.   The patient tolerated the procedure well and was taken to the recovery area awake and in stable condition. She received iv acetaminophen.  The patient will be discharged to home as per PACU criteria. Routine postoperative instructions given. She was prescribed Ibuprofen and Colace. She will follow up in the clinic in two weeks for postoperative evaluation.

## 2016-11-09 NOTE — Interval H&P Note (Signed)
History and Physical Interval Note:  11/09/2016 9:50 AM  [name]  has presented today for surgery, with the diagnosis of PMB  Thickened endometrium  The various methods of treatment have been discussed with the patient and family. After consideration of risks, benefits and other options for treatment, the patient has consented to  Procedure(s): DILATATION AND CURETTAGE /HYSTEROSCOPY (N/A) as a surgical intervention .  The patient's history has been reviewed, patient examined, no change in status, stable for surgery.  I have reviewed the patient's chart and labs.  Questions were answered to the patient's satisfaction.     Benjaman Kindler

## 2016-11-09 NOTE — OR Nursing (Signed)
Received Report from [facility].  Patient complains of feeling " a little tightness in my chest mostly on the right side"  Dr Leafy Ro present assessed patient.

## 2016-11-09 NOTE — OR Nursing (Signed)
Discharge instructions given to patient and husband both verbalized understanding.  Prescription given to husband. Patient denies any chest pain upon discharge.

## 2016-11-09 NOTE — OR Nursing (Signed)
Patient denies any nausea, pain or tingling down arm.  Patient states she feels like a pulled muscle. No new orders at this time.

## 2016-11-09 NOTE — Anesthesia Procedure Notes (Signed)
Procedure Name: Intubation Date/Time: 11/09/2016 11:20 AM Performed by: Demetrius Charity Pre-anesthesia Checklist: Patient identified, Emergency Drugs available and Suction available Oxygen Delivery Method: Circle system utilized Ventilation: Mask ventilation without difficulty Laryngoscope Size: 3 and McGraph Grade View: Grade II Tube type: Oral Tube size: 7.0 mm Number of attempts: 1 Airway Equipment and Method: Stylet Placement Confirmation: ETT inserted through vocal cords under direct vision,  positive ETCO2 and breath sounds checked- equal and bilateral Secured at: 21 cm Tube secured with: Tape Dental Injury: Teeth and Oropharynx as per pre-operative assessment  Difficulty Due To: Difficulty was anticipated and Difficult Airway- due to anterior larynx Comments: DL x 1 with MAC 3 unsuccessful in attempt to view cords.  McGraph 3 used to place ETT on 2nd attempt.  Current positioning maintained during intubation which may have increased difficulty in obtaining view of cords.

## 2016-11-09 NOTE — Discharge Instructions (Signed)
AMBULATORY SURGERY  DISCHARGE INSTRUCTIONS   1) The drugs that you were given will stay in your system until tomorrow so for the next 24 hours you should not:  A) Drive an automobile B) Make any legal decisions C) Drink any alcoholic beverage   2) You may resume regular meals tomorrow.  Today it is better to start with liquids and gradually work up to solid foods.  You may eat anything you prefer, but it is better to start with liquids, then soup and crackers, and gradually work up to solid foods.   3) Please notify your doctor immediately if you have any unusual bleeding, trouble breathing, redness and pain at the surgery site, drainage, fever, or pain not relieved by medication.    4) Additional Instructions:        Please contact your physician with any problems or Same Day Surgery at [phone], Monday through Friday 6 am to 4 pm, or [place] at [facility] number at [phone].   Discharge instructions after a hysteroscopy with dilation and curettage  Signs and Symptoms to Report  Call our office at [phone] if you have any of the following:    Fever over 100.4 degrees or higher  Severe stomach pain not relieved with pain medications  Bright red bleeding thats heavier than a period that does not slow with rest after the first 24 hours  To go the bathroom a lot (frequency), you cant hold your urine (urgency), or it hurts when you empty your bladder (urinate)  Chest pain  Shortness of breath  Pain in the calves of your legs  Severe nausea and vomiting not relieved with anti-nausea medications  Any concerns  What You Can Expect after Surgery  You may see some pink tinged, bloody fluid. This is normal. You may also have cramping for several days.   Activities after Your Discharge Follow these guidelines to help speed your recovery at home:  Dont drive if you are in pain or taking narcotic pain medicine. You may drive when you can  safely slam on the brakes, turn the wheel forcefully, and rotate your torso comfortably. This is typically 4-7 days. Practice in a parking lot or side street prior to attempting to drive regularly.   Ask others to help with household chores for 4 weeks.  Dont do strenuous activities, exercises, or sports like vacuuming, tennis, squash, etc. until your doctor says it is safe to do so.  Walk as you feel able. Rest often since it may take a week or two for your energy level to return to normal.   You may climb stairs  Avoid constipation:   -Eat fruits, vegetables, and whole grains. Eat small meals as your appetite will take time to return to normal.   -Drink 6 to 8 glasses of water each day unless your doctor has told you to limit your fluids.   -Use a laxative or stool softener as needed if constipation becomes a problem. You may take Miralax, metamucil, Citrucil, Colace, Senekot, FiberCon, etc. If this does not relieve the constipation, try two tablespoons of Milk Of Magnesia every 8 hours until your bowels move.   You may shower.   Do not get in a hot tub, swimming pool, etc. until your doctor agrees.  Do not douche, use tampons, or have sex until your doctor says it is okay, usually about 2 weeks.  Take your pain medicine when you need it. The medicine may not work as well if  the pain is bad.  Take the medicines you were taking before surgery. Other medications you might need are pain medications (ibuprofen), medications for constipation (Colace) and nausea medications (Zofran).    Laparoscopic Ovarian Surgery Discharge Instructions  RISKS AND COMPLICATIONS   Infection.  Bleeding.  Injury to surrounding organs.  Anesthetic side effects.   PROCEDURE   You may be given a medicine to help you relax (sedative) before the procedure. You will be given a medicine to make you sleep (general anesthetic) during the procedure.  A tube will be put down your throat to help your breath  while under general anesthesia.  Several small cuts (incisions) are made in the lower abdominal area and one incision is made near the belly button.  Your abdominal area will be inflated with a safe gas (carbon dioxide). This helps give the surgeon room to operate, visualize, and helps the surgeon avoid other organs.  A thin, lighted tube (laparoscope) with a camera attached is inserted into your abdomen through the incision near the belly button. Other small instruments may also be inserted through other abdominal incisions.  The ovary is located and are removed.  After the ovary is removed, the gas is released from the abdomen.  The incisions will be closed with stitches (sutures), and Dermabond. A bandage may be placed over the incisions.  AFTER THE PROCEDURE   You will also have some mild abdominal discomfort for 3-7 days. You will be given pain medicine to ease any discomfort.  As long as there are no problems, you may be allowed to go home. Someone will need to drive you home and be with you for at least 24 hours once home.  You may have some mild discomfort in the throat. This is from the tube placed in your throat while you were sleeping.  You may experience discomfort in the shoulder area from some trapped air between the liver and diaphragm. This sensation is normal and will slowly go away on its own.  HOME CARE INSTRUCTIONS   Take all medicines as directed.  Only take over-the-counter or prescription medicines for pain, discomfort, or fever as directed by your caregiver.  Resume daily activities as directed.  Showers are preferred over baths for 2 weeks.  You may resume sexual activities in 1 week or as you feel you would like to.  Do not drive while taking narcotics.  SEEK MEDICAL CARE IF: .  There is increasing abdominal pain.  You feel lightheaded or faint.  You have the chills.  You have an oral temperature above 102 F (38.9 C).  There is pus-like  (purulent) drainage from any of the wounds.  You are unable to pass gas or have a bowel movement.  You feel sick to your stomach (nauseous) or throw up (vomit) and can't control it with your medicines.  MAKE SURE YOU:   Understand these instructions.  Will watch your condition.  Will get help right away if you are not doing well or get worse.  ExitCare Patient Information 2013 [place].

## 2016-11-09 NOTE — Transfer of Care (Signed)
Immediate Anesthesia Transfer of Care Note  Patient: [name]  Procedure(s) Performed: Procedure(s): DILATATION AND CURETTAGE /HYSTEROSCOPY (N/A) LAPAROSCOPY DIAGNOSTIC (N/A)  Patient Location: PACU  Anesthesia Type:General  Level of Consciousness: sedated  Airway & Oxygen Therapy: Patient Spontanous Breathing  Post-op Assessment: Report given to RN and Post -op Vital signs reviewed and stable  Post vital signs: Reviewed and stable  Last Vitals:  Vitals:   11/09/16 0837 11/09/16 1217  BP: 130/74 120/74  Pulse: 63 68  Resp: 16 16  Temp: 36.8 C 36.3 C    Last Pain:  Vitals:   11/09/16 1217  TempSrc:   PainSc: 0-No pain         Complications: No apparent anesthesia complications

## 2016-11-09 NOTE — Anesthesia Preprocedure Evaluation (Signed)
Anesthesia Evaluation  Patient identified by MRN, date of birth, ID band Patient awake    Reviewed: Allergy & Precautions, H&P , NPO status , Patient's Chart, lab work & pertinent test results  Airway Mallampati: III  TM Distance: <3 FB Neck ROM: limited    Dental  (+) Poor Dentition, Chipped, Caps   Pulmonary neg pulmonary ROS, neg shortness of breath,    Pulmonary exam normal breath sounds clear to auscultation       Cardiovascular Exercise Tolerance: Good (-) angina(-) Past MI negative cardio ROS Normal cardiovascular exam Rhythm:regular Rate:Normal     Neuro/Psych  Neuromuscular disease negative psych ROS   GI/Hepatic negative GI ROS, Neg liver ROS, neg GERD  ,  Endo/Other  Hypothyroidism   Renal/GU      Musculoskeletal  (+) Arthritis ,   Abdominal   Peds  Hematology negative hematology ROS (+)   Anesthesia Other Findings Patient has abrasion bellow left eye  Past Medical History: 11/27/2013: Abnormality of gait No date: Arthritis     Comment: Left shoulder No date: Chronic back pain     Comment: and neck pain No date: Concussion No date: Dyspareunia No date: Environmental allergies No date: Herpes No date: History of subdural hematoma (post traumatic)     Comment: Right temporal No date: Hypothyroid No date: Irritable bowel No date: Menopausal state No date: Skin cancer No date: Skull fracture [facility])  Past Surgical History: 2012: BACK SURGERY     Comment: rods in back 1990: BREAST CYST ASPIRATION Left No date: CARPAL TUNNEL RELEASE No date: CESAREAN SECTION No date: COSMETIC SURGERY     Comment: abd 2007: FRACTURE SURGERY Left     Comment: plate in left, right ankle was fused 2005: HYSTEROSCOPY WITH NOVASURE No date: lumbosacral spine fusion     Comment: L1 compression fracture, fusion at T11-L3 No date: ORIF ANKLE FRACTURE     Comment: Bilateral No date: TENDON REPAIR Left     Comment:  Left forearm 1992: TUBAL LIGATION  BMI    Body Mass Index:  23.69 kg/m      Reproductive/Obstetrics negative OB ROS                             Anesthesia Physical Anesthesia Plan  ASA: III  Anesthesia Plan: General LMA   Post-op Pain Management:    Induction:   Airway Management Planned:   Additional Equipment:   Intra-op Plan:   Post-operative Plan:   Informed Consent: I have reviewed the patients History and Physical, chart, labs and discussed the procedure including the risks, benefits and alternatives for the proposed anesthesia with the patient or authorized representative who has indicated his/her understanding and acceptance.   Dental Advisory Given  Plan Discussed with: Anesthesiologist, CRNA and Surgeon  Anesthesia Plan Comments:         Anesthesia Quick Evaluation

## 2016-11-09 NOTE — Anesthesia Procedure Notes (Signed)
Procedure Name: LMA Insertion Date/Time: 11/09/2016 10:26 AM Performed by: Hedda Slade Pre-anesthesia Checklist: Patient identified, Emergency Drugs available, Suction available and Patient being monitored Patient Re-evaluated:Patient Re-evaluated prior to inductionOxygen Delivery Method: Circle system utilized Preoxygenation: Pre-oxygenation with 100% oxygen Intubation Type: IV induction Ventilation: Mask ventilation without difficulty LMA: LMA inserted LMA Size: 3.5 Number of attempts: 1 Placement Confirmation: positive ETCO2 and breath sounds checked- equal and bilateral Tube secured with: Tape Dental Injury: Teeth and Oropharynx as per pre-operative assessment

## 2016-11-13 LAB — SURGICAL PATHOLOGY

## 2017-04-17 ENCOUNTER — Other Ambulatory Visit: Payer: Self-pay | Admitting: Physical Medicine & Rehabilitation

## 2017-04-17 DIAGNOSIS — M4722 Other spondylosis with radiculopathy, cervical region: Secondary | ICD-10-CM

## 2017-04-17 DIAGNOSIS — M5412 Radiculopathy, cervical region: Secondary | ICD-10-CM

## 2017-04-21 ENCOUNTER — Ambulatory Visit
Admission: RE | Admit: 2017-04-21 | Discharge: 2017-04-21 | Disposition: A | Discharge status: Home / Self Care | Payer: Managed Care, Other (non HMO) | Source: Ambulatory Visit | Attending: Physical Medicine & Rehabilitation | Admitting: Physical Medicine & Rehabilitation

## 2017-04-21 DIAGNOSIS — M4722 Other spondylosis with radiculopathy, cervical region: Secondary | ICD-10-CM

## 2017-04-21 DIAGNOSIS — M5412 Radiculopathy, cervical region: Secondary | ICD-10-CM

## 2017-08-20 ENCOUNTER — Other Ambulatory Visit: Payer: Self-pay | Admitting: Internal Medicine

## 2017-08-20 DIAGNOSIS — Z1231 Encounter for screening mammogram for malignant neoplasm of breast: Secondary | ICD-10-CM

## 2017-08-28 ENCOUNTER — Ambulatory Visit
Admission: RE | Admit: 2017-08-28 | Discharge: 2017-08-28 | Disposition: A | Discharge status: Home / Self Care | Payer: Managed Care, Other (non HMO) | Source: Ambulatory Visit | Attending: Internal Medicine | Admitting: Internal Medicine

## 2017-08-28 ENCOUNTER — Encounter: Payer: Self-pay | Admitting: Radiology

## 2017-08-28 DIAGNOSIS — Z1231 Encounter for screening mammogram for malignant neoplasm of breast: Secondary | ICD-10-CM | POA: Insufficient documentation

## 2017-12-08 IMAGING — MR MR CERVICAL SPINE W/O CM
5 series · 48 of 48 positions shown · non-contrast
Comparison: MRI of the cervical spine 02/01/2013

CLINICAL DATA: Generalized neck pain. Bilateral shoulder pain
numbness. Cervical spondylosis with radiculopathy.

EXAM:
MRI CERVICAL SPINE WITHOUT CONTRAST
TECHNIQUE: Multiplanar, multisequence MR imaging of the cervical spine was
performed. No intravenous contrast was administered.

[Series 3: T2 · sagittal · 3.0mm · 0.82mm/px · 6 of 12 slices shown (1 of 3)]
[im 1/12]
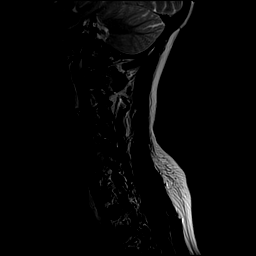
[im 3/12]
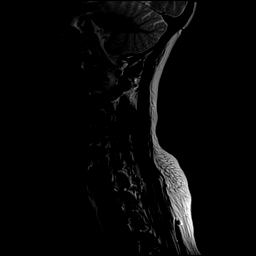
[im 5/12]
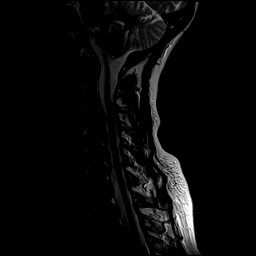
[im 7/12]
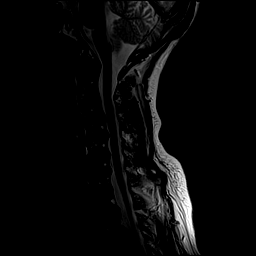
[im 9/12]
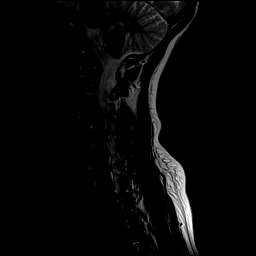
[im 12/12]
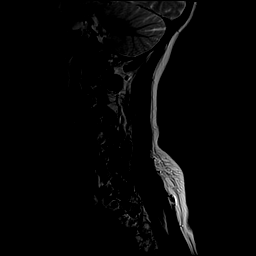

[Series 4: T1 · sagittal · 3.0mm · 0.82mm/px · 7 of 12 slices shown]
[im 1/12]
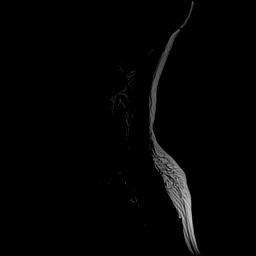
[im 2/12]
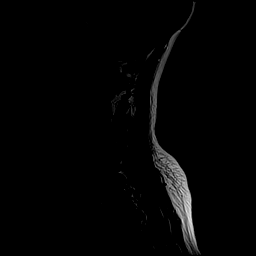
[im 4/12]
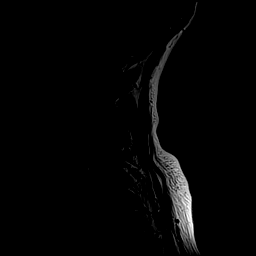
[im 6/12]
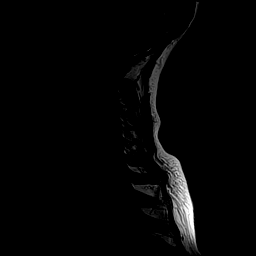
[im 8/12]
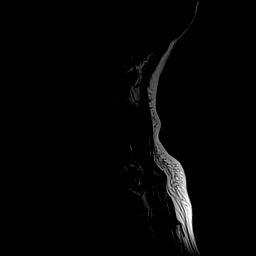
[im 10/12]
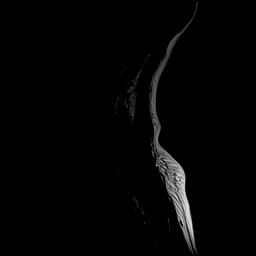
[im 12/12]
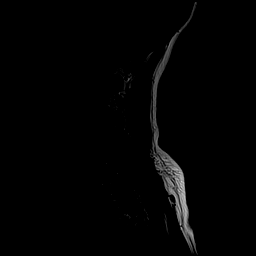

[Series 5: STIR · sagittal · 3.0mm · 0.82mm/px · 7 of 12 slices shown]
[im 1/12]
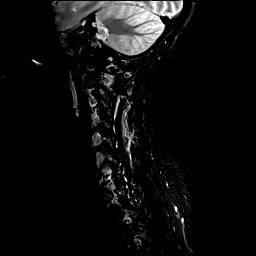
[im 2/12]
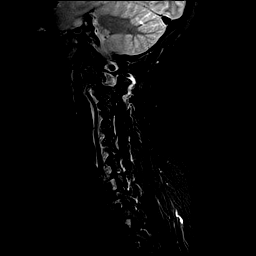
[im 4/12]
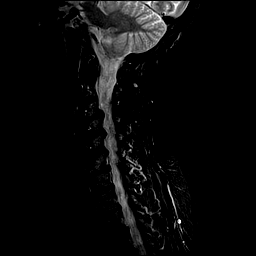
[im 6/12]
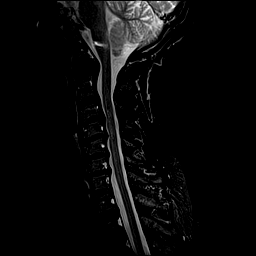
[im 8/12]
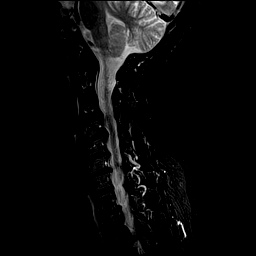
[im 10/12]
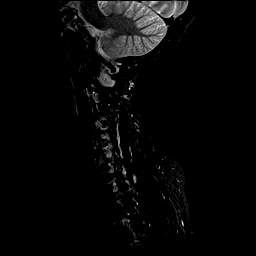
[im 12/12]
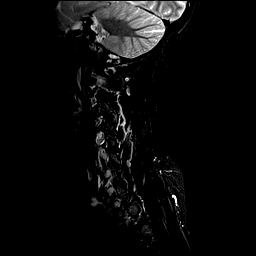

[Series 6: T2 · axial · 3.0mm · 0.74mm/px · z∈[-41,+53]mm · 14 of 26 slices shown (2 of 3)]
[im 1/26]
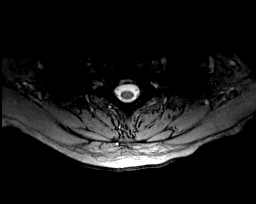
[im 2/26]
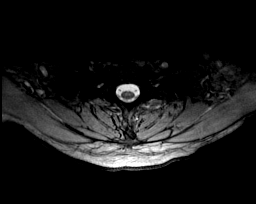
[im 4/26]
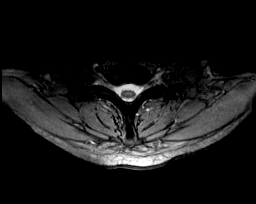
[im 6/26]
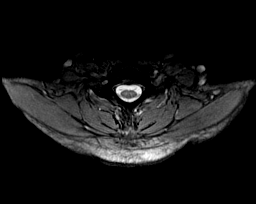
[im 8/26]
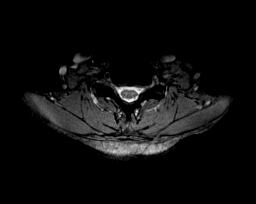
[im 10/26]
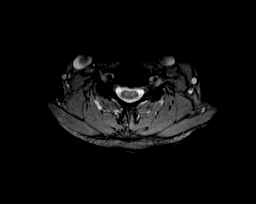
[im 12/26]
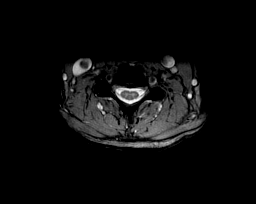
[im 14/26]
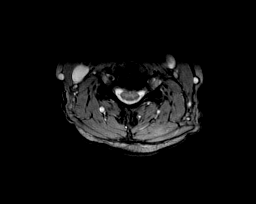
[im 16/26]
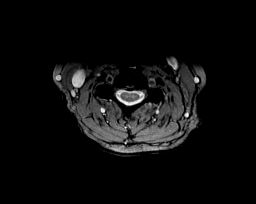
[im 18/26]
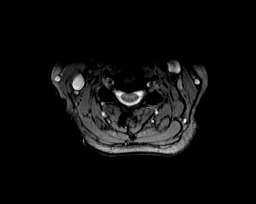
[im 20/26]
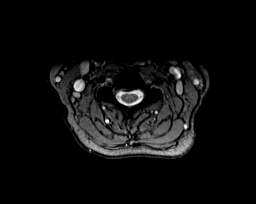
[im 22/26]
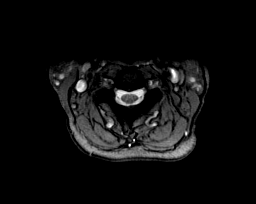
[im 24/26]
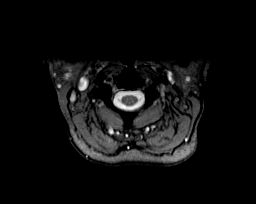
[im 26/26]
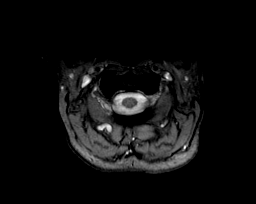

[Series 7: T2 · axial · 3.0mm · 0.74mm/px · z∈[-41,+53]mm · 14 of 25 slices shown (3 of 3)]
[im 1/25]
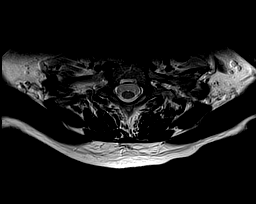
[im 2/25]
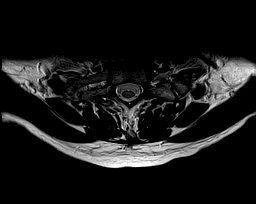
[im 4/25]
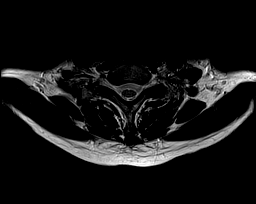
[im 6/25]
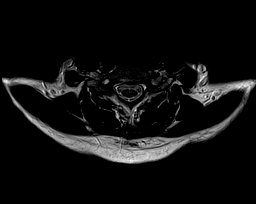
[im 8/25]
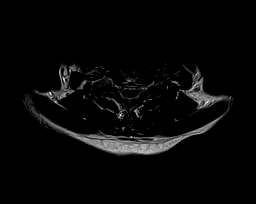
[im 10/25]
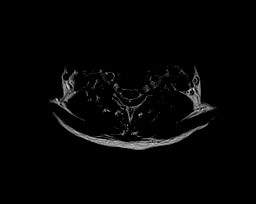
[im 12/25]
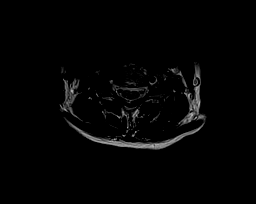
[im 13/25]
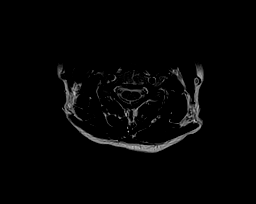
[im 15/25]
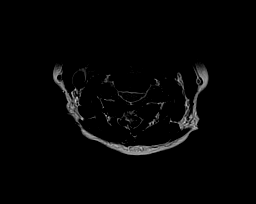
[im 17/25]
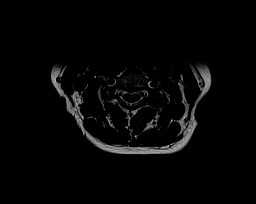
[im 19/25]
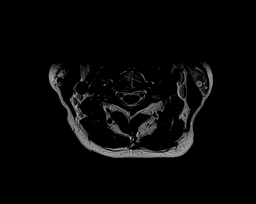
[im 21/25]
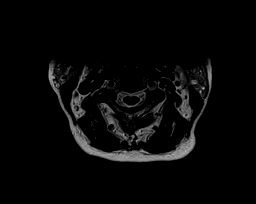
[im 23/25]
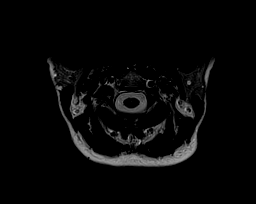
[im 25/25]
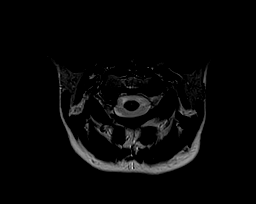

[48 of 48 positions shown; findings below may reference images not displayed]

FINDINGS: Alignment: AP alignment is anatomic. There is some straightening of
the normal cervical lordosis.

Vertebrae: Marrow signal and vertebral body heights are normal.

Cord: Normal signal is present in cervical and upper thoracic spinal
cord to the lowest imaged level, T2-3.

Posterior Fossa, vertebral arteries, paraspinal tissues: The
craniocervical junction is normal. The visualized intracranial
contents are normal. Flow is present in the vertebral arteries
bilaterally.

Disc levels:

C2-3: Asymmetric right-sided facet hypertrophy is again noted. Mild
right foraminal narrowing is similar to the prior study. Central
canal is patent.

C3-4: A broad-based disc osteophyte complex is present.
Uncovertebral and facet hypertrophy is noted bilaterally. Moderate
right and mild left foraminal stenosis demonstrate some progression.

C4-5: A broad-based disc osteophyte complex is present.
Uncovertebral and facet hypertrophy is present bilaterally. This
results in mild bilateral foraminal narrowing.

C5-6: Mild uncovertebral and facet hypertrophy bilaterally stable.
Mild bilateral foraminal narrowing is noted.

C6-7: Asymmetric left-sided uncovertebral spurring is again seen.
Mild bilateral foraminal narrowing is worse on the left. Bilateral
perineural root sleeve cysts are again seen.

C7-T1: Negative.
IMPRESSION: 1. Moderate right and mild left foraminal narrowing at C3-4
demonstrate some progression.
2. Otherwise stable multilevel spondylosis in the cervical spine.
3. Mild right foraminal narrowing at C2-3.
4. Mild foraminal narrowing bilaterally at C4-5 and C5-6.
5. Mild foraminal narrowing at C6-7 is worse on the left.

## 2018-02-02 ENCOUNTER — Encounter: Payer: Self-pay | Admitting: Emergency Medicine

## 2018-02-02 ENCOUNTER — Emergency Department
Admission: EM | Admit: 2018-02-02 | Discharge: 2018-02-02 | Disposition: A | Discharge status: Home / Self Care | Payer: Commercial Managed Care - PPO | Attending: Emergency Medicine | Admitting: Emergency Medicine

## 2018-02-02 ENCOUNTER — Other Ambulatory Visit: Payer: Self-pay

## 2018-02-02 ENCOUNTER — Emergency Department: Payer: Commercial Managed Care - PPO

## 2018-02-02 DIAGNOSIS — W19XXXA Unspecified fall, initial encounter: Secondary | ICD-10-CM | POA: Insufficient documentation

## 2018-02-02 DIAGNOSIS — Y939 Activity, unspecified: Secondary | ICD-10-CM | POA: Diagnosis not present

## 2018-02-02 DIAGNOSIS — R51 Headache: Secondary | ICD-10-CM | POA: Diagnosis not present

## 2018-02-02 DIAGNOSIS — Y999 Unspecified external cause status: Secondary | ICD-10-CM | POA: Diagnosis not present

## 2018-02-02 DIAGNOSIS — Z79899 Other long term (current) drug therapy: Secondary | ICD-10-CM | POA: Insufficient documentation

## 2018-02-02 DIAGNOSIS — E039 Hypothyroidism, unspecified: Secondary | ICD-10-CM | POA: Diagnosis not present

## 2018-02-02 DIAGNOSIS — S0181XA Laceration without foreign body of other part of head, initial encounter: Secondary | ICD-10-CM

## 2018-02-02 DIAGNOSIS — S0993XA Unspecified injury of face, initial encounter: Secondary | ICD-10-CM | POA: Diagnosis not present

## 2018-02-02 DIAGNOSIS — S01111A Laceration without foreign body of right eyelid and periocular area, initial encounter: Secondary | ICD-10-CM | POA: Diagnosis not present

## 2018-02-02 DIAGNOSIS — Z85828 Personal history of other malignant neoplasm of skin: Secondary | ICD-10-CM | POA: Diagnosis not present

## 2018-02-02 DIAGNOSIS — Y929 Unspecified place or not applicable: Secondary | ICD-10-CM | POA: Diagnosis not present

## 2018-02-02 DIAGNOSIS — S0990XA Unspecified injury of head, initial encounter: Secondary | ICD-10-CM | POA: Diagnosis not present

## 2018-02-02 DIAGNOSIS — H05231 Hemorrhage of right orbit: Secondary | ICD-10-CM

## 2018-02-02 DIAGNOSIS — S0511XA Contusion of eyeball and orbital tissues, right eye, initial encounter: Secondary | ICD-10-CM | POA: Diagnosis not present

## 2018-02-02 MED ORDER — LIDOCAINE-EPINEPHRINE-TETRACAINE (LET) SOLUTION
3.0000 mL | Freq: Once | NASAL | Status: AC
  Administered 2018-02-02: 3 mL via TOPICAL
  Filled 2018-02-02: qty 3

## 2018-02-02 NOTE — ED Triage Notes (Signed)
Pt fell from standing and has a lac above right eye. Bleeding controlled

## 2018-02-02 NOTE — Discharge Instructions (Signed)
You may shower but do not submerge head underwater.  Allow Dermabond to come off on its own.  Please follow-up with primary care provider or walk-in clinic to have the stitches removed and 5-6 days.  Take Tylenol and/or ibuprofen as needed for any pain.  Ice hematoma 20 minutes every hour for the next 24 hours.  Return to the ER for any severe headaches, vision changes, nausea vomiting or any worsening symptoms or urgent changes in health.

## 2018-02-02 NOTE — ED Notes (Signed)
NAD noted at time of D/C. Pt denies questions or concerns. Pt ambulatory to the lobby at this time.  

## 2018-02-02 NOTE — ED Provider Notes (Signed)
[facility] EMERGENCY DEPARTMENT Provider Note   CSN: [id number] Arrival date & time: 02/02/18  2831     History   Chief Complaint Chief Complaint  Patient presents with  . Facial Laceration    HPI NEFERTITI MOHAMAD is a 62 y.o. female presents to the emergency department evaluation of a fall just prior to arrival a laceration to the right eyebrow.  Patient denies any vision changes, painful extraocular eye movement, loss of consciousness, nausea or vomiting.  She has been ambulatory.  Her pain is very mild.  She has not had medications for pain.  Her tetanus is up-to-date.  No other injury to her body.  She is not on blood thinners.   HPI  Past Medical History:  Diagnosis Date  . Abnormality of gait 11/27/2013  . Arthritis    Left shoulder  . Chronic back pain    and neck pain  . Concussion   . Dyspareunia   . Environmental allergies   . Herpes   . History of subdural hematoma (post traumatic)    Right temporal  . Hypothyroid   . Irritable bowel   . Menopausal state   . Skin cancer   . Skull fracture [facility])     Patient Active Problem List   Diagnosis Date Noted  . Cervical mass 02/28/2016  . Cervical stenosis (uterine cervix) 02/28/2016  . 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 Menopause 07/27/2015  . Status post endometrial ablation 07/27/2015  . Chronic pain 07/27/2015  . 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 Disease of thyroid gland 07/27/2015  . Neuritis or radiculitis due to rupture of lumbar intervertebral disc 07/13/2014  . Lumbar canal stenosis 07/13/2014  . Abnormality of gait 11/27/2013    Past Surgical History:  Procedure Laterality Date  . BACK SURGERY  2012   rods in back  . BREAST CYST ASPIRATION Left 1990  . CARPAL TUNNEL RELEASE    . CESAREAN SECTION    . COSMETIC SURGERY     abd  . FRACTURE SURGERY Left 2007   plate in left, right ankle was fused  . HYSTEROSCOPY W/D&C N/A 11/09/2016   Procedure: DILATATION AND CURETTAGE  /HYSTEROSCOPY;  Surgeon: Benjaman Kindler, MD;  Location: [facility] ORS;  Service: Gynecology;  Laterality: N/A;  . HYSTEROSCOPY WITH NOVASURE  2005  . LAPAROSCOPY N/A 11/09/2016   Procedure: LAPAROSCOPY DIAGNOSTIC;  Surgeon: Benjaman Kindler, MD;  Location: [facility] ORS;  Service: Gynecology;  Laterality: N/A;  . lumbosacral spine fusion     L1 compression fracture, fusion at T11-L3  . ORIF ANKLE FRACTURE     Bilateral  . TENDON REPAIR Left    Left forearm  . TUBAL LIGATION  1992    OB History    Gravida Para Term Preterm AB Living   3 1 1   2      SAB TAB Ectopic Multiple Live Births                   Home Medications    Prior to Admission medications   Medication Sig Start Date End Date Taking? Authorizing Provider  celecoxib (CELEBREX) 200 MG capsule Take 200 mg by mouth 2 (two) times daily.     [provider]  docusate sodium (COLACE) 100 MG capsule Take 1 capsule (100 mg total) by mouth 2 (two) times daily. To keep stools soft 11/09/16   Benjaman Kindler, MD  DULoxetine (CYMBALTA) 20 MG capsule Take 20 mg by mouth daily.  01/18/16   [provider]  fluticasone (FLONASE) 50 MCG/ACT nasal spray Place 1 spray into both nostrils daily as needed for allergies or rhinitis.    [provider]  ibuprofen (ADVIL,MOTRIN) 800 MG tablet Take 1 tablet (800 mg total) by mouth every 8 (eight) hours as needed for moderate pain. 11/09/16   Benjaman Kindler, MD  levothyroxine (SYNTHROID, LEVOTHROID) 50 MCG tablet Take 50 mcg by mouth daily before breakfast.    [provider]  methocarbamol (ROBAXIN) 750 MG tablet Take 750 mg by mouth 3 (three) times daily.     [provider]  MOVANTIK 25 MG TABS tablet Take 25 mg by mouth daily.  02/09/16   [provider]  Multiple Vitamins-Minerals (MULTIVITAMIN WITH MINERALS) tablet Take 1 tablet by mouth daily.    [provider]  Ospemifene (OSPHENA) 60 MG TABS TAKE 1 TABLET BY MOUTH ONCE DAILY 08/10/16    [provider]  Oxycodone HCl 10 MG TABS Take 10 mg by mouth 2 (two) times daily as needed (pain).     [provider]  oxyCODONE-acetaminophen (PERCOCET/ROXICET) 5-325 MG tablet Take 1-2 tablets by mouth every 6 (six) hours as needed. 11/09/16   Benjaman Kindler, MD  PRESCRIPTION MEDICATION Apply 1-2 g topically 4 (four) times daily as needed (pain). Apply to left hand for pain. Depends on pain level how much and how often patient uses.  Compounded by [employer] in [place], [place] 3% Diclofenac Lidocaine Cyclobenzoprine    [provider]  terbinafine (LAMISIL) 250 MG tablet Take 250 mg by mouth daily.  09/11/16   [provider]  valACYclovir (VALTREX) 1000 MG tablet Take 1,000 mg by mouth daily as needed (outbreaks).     [provider]    Family History Family History  Problem Relation Age of Onset  . Heart failure Mother   . Hypertension Mother   . Diabetes Mother   . Prostate cancer Father   . Heart Problems Father   . Heart attack Brother   . Breast cancer Paternal Grandmother 6  . Cancer Neg Hx     Social History Social History   Tobacco Use  . Smoking status: Never Smoker  . Smokeless tobacco: Never Used  Substance Use Topics  . Alcohol use: Yes    Comment: rarely  . Drug use: No     Allergies   Demerol [meperidine]; Ibuprofen; Percogesic [diphenhydramine-apap (sleep)]; Tetracyclines & related; Other; and Povidone iodine   Review of Systems Review of Systems  Musculoskeletal: Negative for arthralgias, back pain and neck pain.  Skin: Positive for wound. Negative for rash.  Neurological: Positive for headaches (mild around right eye). Negative for numbness.     Physical Exam Updated Vital Signs BP (!) 148/89 (BP Location: Left Arm)   Pulse 63   Temp 97.8 F (36.6 C) (Oral)   Ht 5\' 4"  (1.626 m)   Wt 62.1 kg (137 lb)   SpO2 100%   BMI 23.52 kg/m   Physical Exam  Constitutional: She is oriented to  person, place, and time. She appears well-developed and well-nourished.  HENT:  Head: Normocephalic and atraumatic.  Right Ear: External ear normal.  Left Ear: External ear normal.  Nose: Nose normal.  Eyes: Conjunctivae and EOM are normal. Pupils are equal, round, and reactive to light.  Neck: Normal range of motion.  Cardiovascular: Normal rate.  Pulmonary/Chest: Effort normal and breath sounds normal. No respiratory distress.  Abdominal: Soft. There is no tenderness.  Musculoskeletal: Normal range of motion. She  exhibits no edema or deformity.  Neurological: She is alert and oriented to person, place, and time. No cranial nerve deficit. Coordination normal.  Skin: Skin is warm and dry. No rash noted.  3 cm laceration along the right eyebrow with 2 cm laceration above the eyebrow.  Both lacerations are linear with no visible or palpable foreign body.  She has full extraocular eye movement of the right eye.  Psychiatric: She has a normal mood and affect. Her behavior is normal.     ED Treatments / Results  Labs (all labs ordered are listed, but only abnormal results are displayed) Labs Reviewed - No data to display  EKG  EKG Interpretation None       Radiology Ct Head Wo Contrast  Result Date: 02/02/2018 CLINICAL DATA:  62 year old female status post trip and fall this morning. Right eye laceration. Right facial pain and frontal headache. EXAM: CT HEAD WITHOUT CONTRAST CT MAXILLOFACIAL WITHOUT CONTRAST TECHNIQUE: Multidetector CT imaging of the head and maxillofacial structures were performed using the standard protocol without intravenous contrast. Multiplanar CT image reconstructions of the maxillofacial structures were also generated. COMPARISON:  Cervical spine MRI 04/21/2017. Brain MRI 12/19/2013. Head CT 09/24/2013. Cervical spine CT 09/02/2011. FINDINGS: CT HEAD FINDINGS Brain: Cerebral volume is stable and within normal limits for age. A small area of chronic  encephalomalacia in the left inferior frontal gyrus is stable since 2012. Stable and normal gray-white matter differentiation elsewhere. No midline shift, ventriculomegaly, mass effect, evidence of mass lesion, intracranial hemorrhage or evidence of cortically based acute infarction. Vascular: No suspicious intracranial vascular hyperdensity. Skull: No skull fracture. Other: Right periorbital superficial soft tissue injury, described below. The superior and posterior scalp soft tissues appear normal. Bilateral tympanic cavities and mastoids are clear. CT MAXILLOFACIAL FINDINGS Osseous: Mandible intact. Chronic left nasal bone fracture is stable since 2012. No maxilla or zygoma fracture. Intact central skull base. Visible cervical spine appears intact. Cervical facet degeneration has progressed since 2012, greater on the left. Chronic disc and endplate degeneration at C3-C4, C6-C7. Orbits: Bilateral orbital walls are intact. The right globe is intact. There is superficial right periorbital soft tissue swelling, and a supraorbital soft tissue laceration with a small volume of soft tissue gas. No right intraorbital soft tissue injury. Normal left orbit. Sinuses: Clear. Soft tissues: Visible noncontrast thyroid, larynx, pharynx, parapharyngeal spaces, retropharyngeal space, sublingual space, submandibular spaces and parotid spaces are stable since 2012 and within normal limits. Mild right premalar space soft tissue contusion. No upper cervical lymphadenopathy. IMPRESSION: 1. Superficial right periorbital soft tissue injury with laceration and small volume of soft tissue gas. 2. No associated face or skull fracture. 3. No acute intracranial abnormality, and stable brain with mild left inferior frontal gyrus encephalomalacia since 2012. Electronically Signed   By: Genevie Ann M.D.   On: 02/02/2018 10:47   Ct Maxillofacial Wo Contrast  Result Date: 02/02/2018 CLINICAL DATA:  62 year old female status post trip and fall this  morning. Right eye laceration. Right facial pain and frontal headache. EXAM: CT HEAD WITHOUT CONTRAST CT MAXILLOFACIAL WITHOUT CONTRAST TECHNIQUE: Multidetector CT imaging of the head and maxillofacial structures were performed using the standard protocol without intravenous contrast. Multiplanar CT image reconstructions of the maxillofacial structures were also generated. COMPARISON:  Cervical spine MRI 04/21/2017. Brain MRI 12/19/2013. Head CT 09/24/2013. Cervical spine CT 09/02/2011. FINDINGS: CT HEAD FINDINGS Brain: Cerebral volume is stable and within normal limits for age. A small area of chronic encephalomalacia in the left inferior frontal gyrus  is stable since 2012. Stable and normal gray-white matter differentiation elsewhere. No midline shift, ventriculomegaly, mass effect, evidence of mass lesion, intracranial hemorrhage or evidence of cortically based acute infarction. Vascular: No suspicious intracranial vascular hyperdensity. Skull: No skull fracture. Other: Right periorbital superficial soft tissue injury, described below. The superior and posterior scalp soft tissues appear normal. Bilateral tympanic cavities and mastoids are clear. CT MAXILLOFACIAL FINDINGS Osseous: Mandible intact. Chronic left nasal bone fracture is stable since 2012. No maxilla or zygoma fracture. Intact central skull base. Visible cervical spine appears intact. Cervical facet degeneration has progressed since 2012, greater on the left. Chronic disc and endplate degeneration at C3-C4, C6-C7. Orbits: Bilateral orbital walls are intact. The right globe is intact. There is superficial right periorbital soft tissue swelling, and a supraorbital soft tissue laceration with a small volume of soft tissue gas. No right intraorbital soft tissue injury. Normal left orbit. Sinuses: Clear. Soft tissues: Visible noncontrast thyroid, larynx, pharynx, parapharyngeal spaces, retropharyngeal space, sublingual space, submandibular spaces and  parotid spaces are stable since 2012 and within normal limits. Mild right premalar space soft tissue contusion. No upper cervical lymphadenopathy. IMPRESSION: 1. Superficial right periorbital soft tissue injury with laceration and small volume of soft tissue gas. 2. No associated face or skull fracture. 3. No acute intracranial abnormality, and stable brain with mild left inferior frontal gyrus encephalomalacia since 2012. Electronically Signed   By: Genevie Ann M.D.   On: 02/02/2018 10:47    Procedures .Marland KitchenLaceration Repair Date/Time: 02/02/2018 11:51 AM Performed by: Duanne Guess, PA-C Authorized by: Duanne Guess, PA-C   Consent:    Consent obtained:  Verbal   Consent given by:  Patient   Risks discussed:  Infection, need for additional repair and nerve damage   Alternatives discussed:  No treatment Anesthesia (see MAR for exact dosages):    Anesthesia method:  Topical application   Topical anesthetic:  LET Laceration details:    Location:  Face   Face location:  R eyebrow   Length (cm):  5   Depth (mm):  5 Repair type:    Repair type:  Simple Pre-procedure details:    Preparation:  Patient was prepped and draped in usual sterile fashion Exploration:    Hemostasis achieved with:  LET   Wound exploration: wound explored through full range of motion and entire depth of wound probed and visualized     Wound extent: no fascia violation noted, no foreign bodies/material noted, no muscle damage noted, no nerve damage noted and no underlying fracture noted     Contaminated: no   Treatment:    Area cleansed with:  Betadine and saline   Amount of cleaning:  Standard   Irrigation solution:  Sterile saline   Irrigation volume:  30   Irrigation method:  Pressure wash   Visualized foreign bodies/material removed: no   Skin repair:    Repair method:  Sutures   Suture size:  5-0   Suture material:  Fast-absorbing gut   Number of sutures: 2.  5-0 Vicryl using subcuticular.  #5 6-0 Prolene  sutures used to close superficial layer.  Dermabond used to close small linear laceration above the main laceration within the eyebrow. Approximation:    Approximation:  Close   Vermilion border: well-aligned   Post-procedure details:    Dressing:  Open (no dressing)   Patient tolerance of procedure:  Tolerated well, no immediate complications   (including critical care time)  Medications Ordered in ED Medications  lidocaine-EPINEPHrine-tetracaine (  LET) solution (3 mLs Topical Given by Other 02/02/18 1025)     Initial Impression / Assessment and Plan / ED Course  I have reviewed the triage vital signs and the nursing notes.  Pertinent labs & imaging results that were available during my care of the patient were reviewed by me and considered in my medical decision making (see chart for details).     62 year old female with fall earlier today.  She suffered 2 lacerations to the right eyebrow, CT reviewed by me today showed no intracranial abnormalities with no facial fractures.  Mild soft tissue swelling present.  Lacerations are thoroughly irrigated and repaired she will follow-up in 5-6 days for suture removal.  She is educated on signs and symptoms return to the ED for  Final Clinical Impressions(s) / ED Diagnoses   Final diagnoses:  Facial laceration, initial encounter  Periorbital hematoma, right  Laceration of right eyebrow, initial encounter  Injury of head in adult    ED Discharge Orders    None       Renata Caprice 02/02/18 1154    Lisa Roca, MD 02/02/18 1520

## 2018-02-02 NOTE — ED Notes (Signed)
See triage note  Presents s/p fall  States she fell this am laceration over right eye approx 1 inch  Abrasion noted to right cheek  And having some pain to right knee   ambulates well to treatment room

## 2018-02-10 DIAGNOSIS — H3581 Retinal edema: Secondary | ICD-10-CM | POA: Diagnosis not present

## 2018-02-10 DIAGNOSIS — H53143 Visual discomfort, bilateral: Secondary | ICD-10-CM | POA: Diagnosis not present

## 2018-03-11 DIAGNOSIS — M542 Cervicalgia: Secondary | ICD-10-CM | POA: Diagnosis not present

## 2018-03-11 DIAGNOSIS — M546 Pain in thoracic spine: Secondary | ICD-10-CM | POA: Diagnosis not present

## 2018-03-11 DIAGNOSIS — M5416 Radiculopathy, lumbar region: Secondary | ICD-10-CM | POA: Diagnosis not present

## 2018-04-29 DIAGNOSIS — M7581 Other shoulder lesions, right shoulder: Secondary | ICD-10-CM | POA: Diagnosis not present

## 2018-04-29 DIAGNOSIS — M7582 Other shoulder lesions, left shoulder: Secondary | ICD-10-CM | POA: Diagnosis not present

## 2018-05-20 DIAGNOSIS — N882 Stricture and stenosis of cervix uteri: Secondary | ICD-10-CM | POA: Diagnosis not present

## 2018-05-20 DIAGNOSIS — R9389 Abnormal findings on diagnostic imaging of other specified body structures: Secondary | ICD-10-CM | POA: Diagnosis not present

## 2018-05-20 DIAGNOSIS — N950 Postmenopausal bleeding: Secondary | ICD-10-CM | POA: Diagnosis not present

## 2018-05-23 DIAGNOSIS — E079 Disorder of thyroid, unspecified: Secondary | ICD-10-CM | POA: Diagnosis not present

## 2018-05-23 DIAGNOSIS — Z13220 Encounter for screening for lipoid disorders: Secondary | ICD-10-CM | POA: Diagnosis not present

## 2018-05-23 DIAGNOSIS — Z0000 Encounter for general adult medical examination without abnormal findings: Secondary | ICD-10-CM | POA: Diagnosis not present

## 2018-05-23 DIAGNOSIS — Z79899 Other long term (current) drug therapy: Secondary | ICD-10-CM | POA: Diagnosis not present

## 2018-05-23 DIAGNOSIS — G894 Chronic pain syndrome: Secondary | ICD-10-CM | POA: Diagnosis not present

## 2018-05-23 DIAGNOSIS — R8290 Unspecified abnormal findings in urine: Secondary | ICD-10-CM | POA: Diagnosis not present

## 2018-06-04 DIAGNOSIS — W57XXXD Bitten or stung by nonvenomous insect and other nonvenomous arthropods, subsequent encounter: Secondary | ICD-10-CM | POA: Diagnosis not present

## 2018-06-04 DIAGNOSIS — R6889 Other general symptoms and signs: Secondary | ICD-10-CM | POA: Diagnosis not present

## 2018-06-09 DIAGNOSIS — M549 Dorsalgia, unspecified: Secondary | ICD-10-CM | POA: Diagnosis not present

## 2018-06-09 DIAGNOSIS — M546 Pain in thoracic spine: Secondary | ICD-10-CM | POA: Diagnosis not present

## 2018-06-09 DIAGNOSIS — M542 Cervicalgia: Secondary | ICD-10-CM | POA: Diagnosis not present

## 2018-07-29 DIAGNOSIS — R42 Dizziness and giddiness: Secondary | ICD-10-CM | POA: Diagnosis not present

## 2018-08-13 DIAGNOSIS — H5203 Hypermetropia, bilateral: Secondary | ICD-10-CM | POA: Diagnosis not present

## 2018-09-01 DIAGNOSIS — M546 Pain in thoracic spine: Secondary | ICD-10-CM | POA: Diagnosis not present

## 2018-09-01 DIAGNOSIS — M549 Dorsalgia, unspecified: Secondary | ICD-10-CM | POA: Diagnosis not present

## 2018-09-01 DIAGNOSIS — M542 Cervicalgia: Secondary | ICD-10-CM | POA: Diagnosis not present

## 2018-11-21 DIAGNOSIS — Z79899 Other long term (current) drug therapy: Secondary | ICD-10-CM | POA: Diagnosis not present

## 2018-11-21 DIAGNOSIS — R8290 Unspecified abnormal findings in urine: Secondary | ICD-10-CM | POA: Diagnosis not present

## 2018-11-21 DIAGNOSIS — E079 Disorder of thyroid, unspecified: Secondary | ICD-10-CM | POA: Diagnosis not present

## 2018-11-21 DIAGNOSIS — G894 Chronic pain syndrome: Secondary | ICD-10-CM | POA: Diagnosis not present

## 2018-11-24 DIAGNOSIS — M549 Dorsalgia, unspecified: Secondary | ICD-10-CM | POA: Diagnosis not present

## 2018-11-24 DIAGNOSIS — M546 Pain in thoracic spine: Secondary | ICD-10-CM | POA: Diagnosis not present

## 2018-11-24 DIAGNOSIS — M542 Cervicalgia: Secondary | ICD-10-CM | POA: Diagnosis not present

## 2019-01-02 ENCOUNTER — Other Ambulatory Visit: Payer: Self-pay | Admitting: Internal Medicine

## 2019-01-02 DIAGNOSIS — Z1231 Encounter for screening mammogram for malignant neoplasm of breast: Secondary | ICD-10-CM

## 2019-01-02 DIAGNOSIS — I959 Hypotension, unspecified: Secondary | ICD-10-CM | POA: Diagnosis not present

## 2019-01-27 ENCOUNTER — Ambulatory Visit
Admission: RE | Admit: 2019-01-27 | Discharge: 2019-01-27 | Disposition: A | Discharge status: Home / Self Care | Payer: Commercial Managed Care - PPO | Source: Ambulatory Visit | Attending: Internal Medicine | Admitting: Internal Medicine

## 2019-01-27 DIAGNOSIS — Z1231 Encounter for screening mammogram for malignant neoplasm of breast: Secondary | ICD-10-CM | POA: Diagnosis present

## 2019-03-04 DIAGNOSIS — M546 Pain in thoracic spine: Secondary | ICD-10-CM | POA: Diagnosis not present

## 2019-03-04 DIAGNOSIS — M542 Cervicalgia: Secondary | ICD-10-CM | POA: Diagnosis not present

## 2019-03-04 DIAGNOSIS — M549 Dorsalgia, unspecified: Secondary | ICD-10-CM | POA: Diagnosis not present

## 2019-04-06 DIAGNOSIS — I959 Hypotension, unspecified: Secondary | ICD-10-CM | POA: Diagnosis not present

## 2019-04-09 DIAGNOSIS — M25571 Pain in right ankle and joints of right foot: Secondary | ICD-10-CM | POA: Diagnosis not present

## 2019-04-09 DIAGNOSIS — M79671 Pain in right foot: Secondary | ICD-10-CM | POA: Diagnosis not present

## 2019-04-09 DIAGNOSIS — M19071 Primary osteoarthritis, right ankle and foot: Secondary | ICD-10-CM | POA: Diagnosis not present

## 2019-04-13 DIAGNOSIS — E079 Disorder of thyroid, unspecified: Secondary | ICD-10-CM | POA: Diagnosis not present

## 2019-04-13 DIAGNOSIS — G894 Chronic pain syndrome: Secondary | ICD-10-CM | POA: Diagnosis not present

## 2019-04-13 DIAGNOSIS — Z1211 Encounter for screening for malignant neoplasm of colon: Secondary | ICD-10-CM | POA: Diagnosis not present

## 2019-04-17 DIAGNOSIS — Z1211 Encounter for screening for malignant neoplasm of colon: Secondary | ICD-10-CM | POA: Diagnosis not present

## 2019-08-31 DIAGNOSIS — R030 Elevated blood-pressure reading, without diagnosis of hypertension: Secondary | ICD-10-CM | POA: Insufficient documentation

## 2019-12-30 ENCOUNTER — Other Ambulatory Visit: Payer: Self-pay | Admitting: Internal Medicine

## 2019-12-30 DIAGNOSIS — Z1231 Encounter for screening mammogram for malignant neoplasm of breast: Secondary | ICD-10-CM

## 2020-02-09 DIAGNOSIS — I10 Essential (primary) hypertension: Secondary | ICD-10-CM | POA: Insufficient documentation

## 2020-02-22 ENCOUNTER — Ambulatory Visit: Payer: Commercial Managed Care - PPO | Attending: Internal Medicine

## 2020-02-22 DIAGNOSIS — Z23 Encounter for immunization: Secondary | ICD-10-CM

## 2020-02-22 NOTE — Progress Notes (Signed)
   Covid-19 Vaccination Clinic  Name:  [name]    MRN: [id number] DOB: 1956-03-08  02/22/2020  [name] was observed post Covid-19 immunization for 15 minutes without incident. She was provided with Vaccine Information Sheet and instruction to access the V-Safe system.   [name] was instructed to call 911 with any severe reactions post vaccine: Marland Kitchen Difficulty breathing  . Swelling of face and throat  . A fast heartbeat  . A bad rash all over body  . Dizziness and weakness   Immunizations Administered    Name Date Dose VIS Date Route   Pfizer COVID-19 Vaccine 02/22/2020 10:31 AM 0.3 mL 11/13/2019 Intramuscular   Manufacturer: [place]   Lot: [id number]   [place]: [id number]

## 2020-03-15 ENCOUNTER — Ambulatory Visit: Payer: Commercial Managed Care - PPO

## 2020-03-30 ENCOUNTER — Ambulatory Visit: Payer: Commercial Managed Care - PPO | Attending: Internal Medicine

## 2020-03-30 DIAGNOSIS — Z23 Encounter for immunization: Secondary | ICD-10-CM

## 2020-03-30 NOTE — Progress Notes (Signed)
   Covid-19 Vaccination Clinic  Name:  KAALA REEFER    MRN: [id number] DOB: [birth year]  03/30/2020  Ms. Albracht was observed post Covid-19 immunization for 15 minutes without incident. She was provided with Vaccine Information Sheet and instruction to access the V-Safe system.   Ms. Lipton was instructed to call 911 with any severe reactions post vaccine: Marland Kitchen Difficulty breathing  . Swelling of face and throat  . A fast heartbeat  . A bad rash all over body  . Dizziness and weakness   Immunizations Administered    Name Date Dose VIS Date Route   Pfizer COVID-19 Vaccine 03/30/2020 10:15 AM 0.3 mL 01/27/2019 Intramuscular   Manufacturer: [place]   Lot: [id number]   [place]: [id number]

## 2020-06-29 ENCOUNTER — Other Ambulatory Visit: Payer: Self-pay | Admitting: Obstetrics and Gynecology

## 2020-06-29 DIAGNOSIS — Z1231 Encounter for screening mammogram for malignant neoplasm of breast: Secondary | ICD-10-CM

## 2020-07-20 ENCOUNTER — Ambulatory Visit
Admission: RE | Admit: 2020-07-20 | Discharge: 2020-07-20 | Disposition: A | Discharge status: Home / Self Care | Payer: Commercial Managed Care - PPO | Source: Ambulatory Visit | Attending: Obstetrics and Gynecology | Admitting: Obstetrics and Gynecology

## 2020-07-20 ENCOUNTER — Other Ambulatory Visit: Payer: Self-pay

## 2020-07-20 DIAGNOSIS — Z1231 Encounter for screening mammogram for malignant neoplasm of breast: Secondary | ICD-10-CM | POA: Diagnosis present

## 2020-10-19 ENCOUNTER — Other Ambulatory Visit: Payer: Self-pay | Admitting: Physical Medicine & Rehabilitation

## 2020-10-19 ENCOUNTER — Other Ambulatory Visit: Payer: Self-pay | Admitting: Pulmonary Disease

## 2020-10-19 DIAGNOSIS — M4325 Fusion of spine, thoracolumbar region: Secondary | ICD-10-CM

## 2020-10-19 DIAGNOSIS — T84498A Other mechanical complication of other internal orthopedic devices, implants and grafts, initial encounter: Secondary | ICD-10-CM

## 2020-10-19 DIAGNOSIS — S32018S Other fracture of first lumbar vertebra, sequela: Secondary | ICD-10-CM

## 2020-10-19 DIAGNOSIS — M519 Unspecified thoracic, thoracolumbar and lumbosacral intervertebral disc disorder: Secondary | ICD-10-CM

## 2020-11-03 ENCOUNTER — Ambulatory Visit
Admission: RE | Admit: 2020-11-03 | Discharge: 2020-11-03 | Disposition: A | Discharge status: Home / Self Care | Payer: Commercial Managed Care - PPO | Source: Ambulatory Visit | Attending: Physical Medicine & Rehabilitation | Admitting: Physical Medicine & Rehabilitation

## 2020-11-03 DIAGNOSIS — M4325 Fusion of spine, thoracolumbar region: Secondary | ICD-10-CM

## 2020-11-03 DIAGNOSIS — T84498A Other mechanical complication of other internal orthopedic devices, implants and grafts, initial encounter: Secondary | ICD-10-CM

## 2020-11-03 DIAGNOSIS — S32018S Other fracture of first lumbar vertebra, sequela: Secondary | ICD-10-CM

## 2020-11-04 ENCOUNTER — Ambulatory Visit
Admission: RE | Admit: 2020-11-04 | Discharge: 2020-11-04 | Disposition: A | Discharge status: Home / Self Care | Payer: Commercial Managed Care - PPO | Source: Ambulatory Visit | Attending: Physical Medicine & Rehabilitation | Admitting: Physical Medicine & Rehabilitation

## 2020-11-04 ENCOUNTER — Other Ambulatory Visit: Payer: Self-pay

## 2020-11-04 DIAGNOSIS — M519 Unspecified thoracic, thoracolumbar and lumbosacral intervertebral disc disorder: Secondary | ICD-10-CM

## 2020-11-06 ENCOUNTER — Other Ambulatory Visit: Payer: Commercial Managed Care - PPO

## 2021-01-02 ENCOUNTER — Other Ambulatory Visit: Payer: Self-pay | Admitting: Physical Medicine & Rehabilitation

## 2021-01-02 DIAGNOSIS — M19012 Primary osteoarthritis, left shoulder: Secondary | ICD-10-CM

## 2021-01-02 DIAGNOSIS — M1612 Unilateral primary osteoarthritis, left hip: Secondary | ICD-10-CM

## 2021-01-20 ENCOUNTER — Other Ambulatory Visit: Payer: Self-pay

## 2021-01-20 ENCOUNTER — Ambulatory Visit
Admission: RE | Admit: 2021-01-20 | Discharge: 2021-01-20 | Disposition: A | Discharge status: Home / Self Care | Payer: Commercial Managed Care - PPO | Source: Ambulatory Visit | Attending: Physical Medicine & Rehabilitation | Admitting: Physical Medicine & Rehabilitation

## 2021-01-20 DIAGNOSIS — M1612 Unilateral primary osteoarthritis, left hip: Secondary | ICD-10-CM

## 2021-01-20 DIAGNOSIS — M19012 Primary osteoarthritis, left shoulder: Secondary | ICD-10-CM

## 2021-02-21 ENCOUNTER — Other Ambulatory Visit: Payer: Self-pay | Admitting: Internal Medicine

## 2021-02-21 DIAGNOSIS — Z1231 Encounter for screening mammogram for malignant neoplasm of breast: Secondary | ICD-10-CM

## 2021-03-08 ENCOUNTER — Other Ambulatory Visit: Payer: Self-pay | Admitting: Internal Medicine

## 2021-03-08 DIAGNOSIS — Z1231 Encounter for screening mammogram for malignant neoplasm of breast: Secondary | ICD-10-CM

## 2021-03-23 LAB — COLOGUARD
COLOGUARD: NEGATIVE
COLOGUARD: NEGATIVE

## 2021-08-01 ENCOUNTER — Other Ambulatory Visit: Payer: Self-pay

## 2021-08-01 ENCOUNTER — Ambulatory Visit
Admission: RE | Admit: 2021-08-01 | Discharge: 2021-08-01 | Disposition: A | Discharge status: Home / Self Care | Payer: Commercial Managed Care - PPO | Source: Ambulatory Visit | Attending: Internal Medicine | Admitting: Internal Medicine

## 2021-08-01 DIAGNOSIS — Z1231 Encounter for screening mammogram for malignant neoplasm of breast: Secondary | ICD-10-CM | POA: Insufficient documentation

## 2021-08-23 ENCOUNTER — Other Ambulatory Visit: Payer: Self-pay | Admitting: Physical Medicine & Rehabilitation

## 2021-08-23 DIAGNOSIS — M12811 Other specific arthropathies, not elsewhere classified, right shoulder: Secondary | ICD-10-CM

## 2021-08-23 DIAGNOSIS — M12812 Other specific arthropathies, not elsewhere classified, left shoulder: Secondary | ICD-10-CM

## 2021-09-03 ENCOUNTER — Ambulatory Visit
Admission: RE | Admit: 2021-09-03 | Discharge: 2021-09-03 | Disposition: A | Discharge status: Home / Self Care | Payer: Commercial Managed Care - PPO | Source: Ambulatory Visit | Attending: Physical Medicine & Rehabilitation | Admitting: Physical Medicine & Rehabilitation

## 2021-09-03 ENCOUNTER — Other Ambulatory Visit: Payer: Self-pay

## 2021-09-03 DIAGNOSIS — M12811 Other specific arthropathies, not elsewhere classified, right shoulder: Secondary | ICD-10-CM

## 2021-09-03 DIAGNOSIS — M12812 Other specific arthropathies, not elsewhere classified, left shoulder: Secondary | ICD-10-CM

## 2021-09-07 DIAGNOSIS — R739 Hyperglycemia, unspecified: Secondary | ICD-10-CM | POA: Insufficient documentation

## 2021-11-24 ENCOUNTER — Other Ambulatory Visit: Payer: Self-pay

## 2021-11-24 ENCOUNTER — Encounter (HOSPITAL_BASED_OUTPATIENT_CLINIC_OR_DEPARTMENT_OTHER): Payer: Self-pay | Admitting: Orthopaedic Surgery

## 2021-12-05 NOTE — H&P (Signed)
PREOPERATIVE H&P  Chief Complaint: RIGHT SHOULDER CARTILAGE DISORDER, IMPINGEMENT SYNDROME,BICIPITAL TENDINITIS, ROTATOR CUFF TEAR  HPI: [name] is a 66 y.o. female who is scheduled for, Procedure(s): SHOULDER ARTHROSCOPY WITH SUBACROMIAL DECOMPRESSION, ROTATOR CUFF REPAIR AND BICEP TENDON REPAIR ARTHROSCOPY SHOULDER DEBRIDEMENT.   Patient has a past medical history significant for burst fracture L1 with removal of hardware associated with L1 fx in April 2022, subdural hematoma, hypothyroidism .   She is a 66 year old with known cuff tear, retracted to the midhumeral head and an MRI from January.  She has been trying PRP as well as other injections to try and help her symptoms.  She continues to have reported limitations in function and she reports pain at night.    Her symptoms are rated as moderate to severe, and have been worsening.  This is significantly impairing activities of daily living.    Please see clinic note for further details on this patient's care.    She has elected for surgical management.   Past Medical History:  Diagnosis Date   Abnormality of gait 11/27/2013   Arthritis    Left shoulder   Basal cell carcinoma    Nose, x2   Chronic back pain    and neck pain   Concussion    Dyspareunia    Environmental allergies    Herpes    History of subdural hematoma (post traumatic)    Right temporal   Hypothyroid    Irritable bowel    Menopausal state    Skin cancer    Skull fracture [facility])    Past Surgical History:  Procedure Laterality Date   BACK SURGERY  2012   rods in back   BREAST CYST ASPIRATION Left [place]     abd   FRACTURE SURGERY Left 2007   plate in left, right ankle was fused   HYSTEROSCOPY WITH D & C N/A 11/09/2016   Procedure: DILATATION AND CURETTAGE /HYSTEROSCOPY;  Surgeon: Benjaman Kindler, MD;  Location: [facility] ORS;  Service: Gynecology;  Laterality: N/A;    HYSTEROSCOPY WITH NOVASURE  2005   LAPAROSCOPY N/A 11/09/2016   Procedure: LAPAROSCOPY DIAGNOSTIC;  Surgeon: Benjaman Kindler, MD;  Location: [facility] ORS;  Service: Gynecology;  Laterality: N/A;   lumbosacral spine fusion     L1 compression fracture, fusion at T11-L3   ORIF ANKLE FRACTURE     Bilateral   TENDON REPAIR Left    Left forearm   TUBAL LIGATION  1992   Social History   Socioeconomic History   Marital status: Married    Spouse name: Doctor, general practice   Number of children: 1   Years of education: College gr   Highest education level: Not on file  Occupational History    Comment: part time/ self -employed   Tobacco Use   Smoking status: Never   Smokeless tobacco: Never  Vaping Use   Vaping Use: Never used  Substance and Sexual Activity   Alcohol use: Yes    Comment: rarely   Drug use: No   Sexual activity: Yes    Birth control/protection: Surgical  Other Topics Concern   Not on file  Social History Narrative   Patient lives at home with family.   Caffeine Use: 1 cup in am ;  1 cup of tea in afternoon   Social Determinants of Health   Financial Resource Strain: Not on file  Food Insecurity:  Not on file  Transportation Needs: Not on file  Physical Activity: Not on file  Stress: Not on file  Social Connections: Not on file   Family History  Problem Relation Age of Onset   Heart failure Mother    Hypertension Mother    Diabetes Mother    Prostate cancer Father    Heart Problems Father    Heart attack Brother    Breast cancer Paternal Grandmother 51   Cancer Neg Hx    Allergies  Allergen Reactions   Demerol [Meperidine] Nausea Only   Ibuprofen Other (See Comments)    abd cramps and rectal bleeding   Percogesic [Diphenhydramine-Apap (Sleep)] Other (See Comments)    Passed out. Patient not sure if the passing out was due to this medication or post surgery.  Unsure.   Tetracyclines & Related Other (See Comments)    Muscle soreness and abdominal pain .  Doxycycline is  okay   Tape Other (See Comments)    Skin rash Skin rash Skin rash    Other Rash    Skin rash   Povidone Iodine Rash    Skin rash if left on skin too long.   Prior to Admission medications   Medication Sig Start Date End Date Taking? Authorizing Provider  amitriptyline (ELAVIL) 25 MG tablet Take 25 mg by mouth at bedtime.   Yes [provider]  celecoxib (CELEBREX) 200 MG capsule Take 200 mg by mouth 2 (two) times daily.    Yes [provider]  docusate sodium (COLACE) 100 MG capsule Take 1 capsule (100 mg total) by mouth 2 (two) times daily. To keep stools soft 11/09/16  Yes Benjaman Kindler, MD  DULoxetine (CYMBALTA) 20 MG capsule Take 20 mg by mouth daily.  01/18/16  Yes [provider]  levothyroxine (SYNTHROID, LEVOTHROID) 50 MCG tablet Take 50 mcg by mouth daily before breakfast.   Yes [provider]  methocarbamol (ROBAXIN) 750 MG tablet Take 750 mg by mouth 3 (three) times daily.    Yes [provider]  MOVANTIK 25 MG TABS tablet Take 25 mg by mouth daily.  02/09/16  Yes [provider]  Multiple Vitamins-Minerals (MULTIVITAMIN WITH MINERALS) tablet Take 1 tablet by mouth daily.   Yes [provider]  Oxycodone HCl 10 MG TABS Take 10 mg by mouth 2 (two) times daily as needed (pain).    Yes [provider]  valACYclovir (VALTREX) 1000 MG tablet Take 1,000 mg by mouth daily as needed (outbreaks).    Yes [provider]  fluticasone (FLONASE) 50 MCG/ACT nasal spray Place 1 spray into both nostrils daily as needed for allergies or rhinitis.    [provider]  PRESCRIPTION MEDICATION Apply 1-2 g topically 4 (four) times daily as needed (pain). Apply to left hand for pain. Depends on pain level how much and how often patient uses.  Compounded by [employer] in [place], [place] 3% Diclofenac Lidocaine Cyclobenzoprine    [provider]    ROS: All other systems have been reviewed and  were otherwise negative with the exception of those mentioned in the HPI and as above.  Physical Exam: General: Alert, no acute distress Cardiovascular: No pedal edema Respiratory: No cyanosis, no use of accessory musculature GI: No organomegaly, abdomen is soft and non-tender Skin: No lesions in the area of chief complaint Neurologic: Sensation intact distally Psychiatric: Patient is competent for consent with normal mood and affect Lymphatic: No axillary or cervical lymphadenopathy  MUSCULOSKELETAL:  Right  shoulder: Forward elevation is to 170 degrees and external rotation to 60 degrees, cuff strength 4/5 throughout, positive OBriens, positive impingement, negative AC tenderness to palpation.    Imaging: MRI demonstrates a split retracted tear of the supraspinatus and infraspinatus with an undersurface leaflet of retraction past the midhumeral head.    Assessment: RIGHT SHOULDER CARTILAGE DISORDER, IMPINGEMENT SYNDROME,BICIPITAL TENDINITIS, ROTATOR CUFF TEAR  Plan: Plan for Procedure(s): SHOULDER ARTHROSCOPY WITH SUBACROMIAL DECOMPRESSION, ROTATOR CUFF REPAIR AND BICEP TENDON REPAIR ARTHROSCOPY SHOULDER DEBRIDEMENT  The risks benefits and alternatives were discussed with the patient including but not limited to the risks of nonoperative treatment, versus surgical intervention including infection, bleeding, nerve injury,  blood clots, cardiopulmonary complications, morbidity, mortality, among others, and they were willing to proceed.   The patient acknowledged the explanation, agreed to proceed with the plan and consent was signed.   Operative Plan: Right shoulder scope with cuff repair and either SCR vs a cuff mend augment, biceps tenodesis versus tenotomy Discharge Medications: Tylenol, Celebrex, Oxycodone for breakthrough pain, Zofran DVT Prophylaxis: None  Physical Therapy: Outpatient PT Special Discharge needs: Sling. [place], PA-C  12/05/2021 8:23  PM

## 2021-12-07 ENCOUNTER — Other Ambulatory Visit: Payer: Self-pay

## 2021-12-07 ENCOUNTER — Ambulatory Visit (HOSPITAL_BASED_OUTPATIENT_CLINIC_OR_DEPARTMENT_OTHER)
Admission: RE | Admit: 2021-12-07 | Discharge: 2021-12-07 | Disposition: A | Discharge status: Home / Self Care | Payer: Commercial Managed Care - PPO | Attending: Orthopaedic Surgery | Admitting: Orthopaedic Surgery

## 2021-12-07 ENCOUNTER — Encounter (HOSPITAL_BASED_OUTPATIENT_CLINIC_OR_DEPARTMENT_OTHER)
Admission: RE | Disposition: A | Discharge status: Home / Self Care | Payer: Self-pay | Source: Home / Self Care | Attending: Orthopaedic Surgery

## 2021-12-07 ENCOUNTER — Ambulatory Visit (HOSPITAL_BASED_OUTPATIENT_CLINIC_OR_DEPARTMENT_OTHER): Payer: Commercial Managed Care - PPO | Admitting: Certified Registered"

## 2021-12-07 ENCOUNTER — Encounter (HOSPITAL_BASED_OUTPATIENT_CLINIC_OR_DEPARTMENT_OTHER): Payer: Self-pay | Admitting: Orthopaedic Surgery

## 2021-12-07 DIAGNOSIS — M25811 Other specified joint disorders, right shoulder: Secondary | ICD-10-CM | POA: Insufficient documentation

## 2021-12-07 DIAGNOSIS — E039 Hypothyroidism, unspecified: Secondary | ICD-10-CM | POA: Diagnosis not present

## 2021-12-07 DIAGNOSIS — S43431A Superior glenoid labrum lesion of right shoulder, initial encounter: Secondary | ICD-10-CM | POA: Diagnosis not present

## 2021-12-07 DIAGNOSIS — X58XXXA Exposure to other specified factors, initial encounter: Secondary | ICD-10-CM | POA: Insufficient documentation

## 2021-12-07 DIAGNOSIS — M75121 Complete rotator cuff tear or rupture of right shoulder, not specified as traumatic: Secondary | ICD-10-CM | POA: Insufficient documentation

## 2021-12-07 DIAGNOSIS — M7541 Impingement syndrome of right shoulder: Secondary | ICD-10-CM | POA: Diagnosis not present

## 2021-12-07 HISTORY — PX: SHOULDER ARTHROSCOPY WITH SUBACROMIAL DECOMPRESSION, ROTATOR CUFF REPAIR AND BICEP TENDON REPAIR: SHX5687

## 2021-12-07 HISTORY — PX: SHOULDER ARTHROSCOPY: SHX128

## 2021-12-07 SURGERY — SHOULDER ARTHROSCOPY WITH SUBACROMIAL DECOMPRESSION, ROTATOR CUFF REPAIR AND BICEP TENDON REPAIR
Anesthesia: General | Site: Shoulder | Laterality: Right

## 2021-12-07 MED ORDER — ROCURONIUM BROMIDE 10 MG/ML (PF) SYRINGE
PREFILLED_SYRINGE | INTRAVENOUS | Status: AC
  Filled 2021-12-07: qty 10

## 2021-12-07 MED ORDER — GABAPENTIN 100 MG PO CAPS: 100.0000 mg | ORAL_CAPSULE | Freq: Three times a day (TID) | ORAL | 0 refills | Status: AC

## 2021-12-07 MED ORDER — OXYCODONE HCL 5 MG PO TABS: ORAL_TABLET | ORAL | 0 refills | Status: AC

## 2021-12-07 MED ORDER — BUPIVACAINE LIPOSOME 1.3 % IJ SUSP
INTRAMUSCULAR | Status: DC | PRN
  Administered 2021-12-07: 10 mL via PERINEURAL

## 2021-12-07 MED ORDER — BUPIVACAINE-EPINEPHRINE (PF) 0.5% -1:200000 IJ SOLN
INTRAMUSCULAR | Status: DC | PRN
  Administered 2021-12-07: 15 mL via PERINEURAL

## 2021-12-07 MED ORDER — LACTATED RINGERS IV SOLN: INTRAVENOUS | Status: DC

## 2021-12-07 MED ORDER — TRANEXAMIC ACID-NACL 1000-0.7 MG/100ML-% IV SOLN
INTRAVENOUS | Status: AC
  Filled 2021-12-07: qty 100

## 2021-12-07 MED ORDER — DEXAMETHASONE SODIUM PHOSPHATE 10 MG/ML IJ SOLN
INTRAMUSCULAR | Status: AC
  Filled 2021-12-07: qty 1

## 2021-12-07 MED ORDER — PROPOFOL 10 MG/ML IV BOLUS
INTRAVENOUS | Status: DC | PRN
  Administered 2021-12-07: 120 mg via INTRAVENOUS

## 2021-12-07 MED ORDER — EPHEDRINE 5 MG/ML INJ
INTRAVENOUS | Status: AC
  Filled 2021-12-07: qty 5

## 2021-12-07 MED ORDER — CEFAZOLIN SODIUM-DEXTROSE 2-4 GM/100ML-% IV SOLN
2.0000 g | INTRAVENOUS | Status: AC
  Administered 2021-12-07: 2 g via INTRAVENOUS

## 2021-12-07 MED ORDER — PHENYLEPHRINE HCL (PRESSORS) 10 MG/ML IV SOLN
INTRAVENOUS | Status: AC
  Filled 2021-12-07: qty 1

## 2021-12-07 MED ORDER — ACETAMINOPHEN 500 MG PO TABS: 1000.0000 mg | ORAL_TABLET | Freq: Three times a day (TID) | ORAL | 0 refills | Status: AC

## 2021-12-07 MED ORDER — ONDANSETRON HCL 4 MG PO TABS: 4.0000 mg | ORAL_TABLET | Freq: Three times a day (TID) | ORAL | 0 refills | Status: AC | PRN

## 2021-12-07 MED ORDER — OXYCODONE HCL 5 MG PO TABS: 5.0000 mg | ORAL_TABLET | Freq: Once | ORAL | Status: DC | PRN

## 2021-12-07 MED ORDER — ONDANSETRON HCL 4 MG/2ML IJ SOLN
INTRAMUSCULAR | Status: AC
  Filled 2021-12-07: qty 2

## 2021-12-07 MED ORDER — LIDOCAINE 2% (20 MG/ML) 5 ML SYRINGE
INTRAMUSCULAR | Status: AC
  Filled 2021-12-07: qty 5

## 2021-12-07 MED ORDER — PHENYLEPHRINE 40 MCG/ML (10ML) SYRINGE FOR IV PUSH (FOR BLOOD PRESSURE SUPPORT)
PREFILLED_SYRINGE | INTRAVENOUS | Status: AC
  Filled 2021-12-07: qty 10

## 2021-12-07 MED ORDER — ROCURONIUM BROMIDE 100 MG/10ML IV SOLN
INTRAVENOUS | Status: DC | PRN
  Administered 2021-12-07: 50 mg via INTRAVENOUS

## 2021-12-07 MED ORDER — FENTANYL CITRATE (PF) 100 MCG/2ML IJ SOLN
INTRAMUSCULAR | Status: AC
  Filled 2021-12-07: qty 2

## 2021-12-07 MED ORDER — SUGAMMADEX SODIUM 200 MG/2ML IV SOLN
INTRAVENOUS | Status: DC | PRN
  Administered 2021-12-07: 140 mg via INTRAVENOUS

## 2021-12-07 MED ORDER — TRANEXAMIC ACID-NACL 1000-0.7 MG/100ML-% IV SOLN
1000.0000 mg | INTRAVENOUS | Status: AC
  Administered 2021-12-07: 1000 mg via INTRAVENOUS

## 2021-12-07 MED ORDER — CEFAZOLIN SODIUM-DEXTROSE 2-4 GM/100ML-% IV SOLN
INTRAVENOUS | Status: AC
  Filled 2021-12-07: qty 100

## 2021-12-07 MED ORDER — MIDAZOLAM HCL 2 MG/2ML IJ SOLN
2.0000 mg | Freq: Once | INTRAMUSCULAR | Status: AC
  Administered 2021-12-07: 2 mg via INTRAVENOUS

## 2021-12-07 MED ORDER — PROPOFOL 10 MG/ML IV BOLUS
INTRAVENOUS | Status: AC
  Filled 2021-12-07: qty 20

## 2021-12-07 MED ORDER — PHENYLEPHRINE HCL-NACL 20-0.9 MG/250ML-% IV SOLN
INTRAVENOUS | Status: DC | PRN
  Administered 2021-12-07: 50 ug/min via INTRAVENOUS

## 2021-12-07 MED ORDER — ATROPINE SULFATE 0.4 MG/ML IV SOLN
INTRAVENOUS | Status: AC
  Filled 2021-12-07: qty 1

## 2021-12-07 MED ORDER — SODIUM CHLORIDE 0.9 % IR SOLN
Status: DC | PRN
  Administered 2021-12-07: 12000 mL

## 2021-12-07 MED ORDER — FENTANYL CITRATE (PF) 100 MCG/2ML IJ SOLN
50.0000 ug | Freq: Once | INTRAMUSCULAR | Status: AC
  Administered 2021-12-07: 50 ug via INTRAVENOUS

## 2021-12-07 MED ORDER — ACETAMINOPHEN 500 MG PO TABS
ORAL_TABLET | ORAL | Status: AC
  Filled 2021-12-07: qty 2

## 2021-12-07 MED ORDER — EPHEDRINE SULFATE 50 MG/ML IJ SOLN
INTRAMUSCULAR | Status: DC | PRN
  Administered 2021-12-07: 5 mg via INTRAVENOUS
  Administered 2021-12-07 (×3): 10 mg via INTRAVENOUS

## 2021-12-07 MED ORDER — LIDOCAINE 2% (20 MG/ML) 5 ML SYRINGE
INTRAMUSCULAR | Status: DC | PRN
  Administered 2021-12-07: 60 mg via INTRAVENOUS

## 2021-12-07 MED ORDER — OXYCODONE HCL 5 MG/5ML PO SOLN
5.0000 mg | Freq: Once | ORAL | Status: DC | PRN
Start: 2021-12-07 — End: 2021-12-07

## 2021-12-07 MED ORDER — FENTANYL CITRATE (PF) 100 MCG/2ML IJ SOLN: 25.0000 ug | INTRAMUSCULAR | Status: DC | PRN

## 2021-12-07 MED ORDER — MIDAZOLAM HCL 2 MG/2ML IJ SOLN
INTRAMUSCULAR | Status: AC
  Filled 2021-12-07: qty 2

## 2021-12-07 MED ORDER — ACETAMINOPHEN 500 MG PO TABS
1000.0000 mg | ORAL_TABLET | Freq: Once | ORAL | Status: AC
Start: 2021-12-07 — End: 2021-12-07
  Administered 2021-12-07: 1000 mg via ORAL

## 2021-12-07 MED ORDER — ONDANSETRON HCL 4 MG/2ML IJ SOLN
INTRAMUSCULAR | Status: DC | PRN
  Administered 2021-12-07: 4 mg via INTRAVENOUS

## 2021-12-07 MED ORDER — FENTANYL CITRATE (PF) 100 MCG/2ML IJ SOLN
INTRAMUSCULAR | Status: DC | PRN
  Administered 2021-12-07: 50 ug via INTRAVENOUS

## 2021-12-07 MED ORDER — DEXAMETHASONE SODIUM PHOSPHATE 10 MG/ML IJ SOLN
INTRAMUSCULAR | Status: DC | PRN
  Administered 2021-12-07: 5 mg via INTRAVENOUS

## 2021-12-07 MED ORDER — CELECOXIB 200 MG PO CAPS: 200.0000 mg | ORAL_CAPSULE | Freq: Two times a day (BID) | ORAL | 0 refills | Status: AC

## 2021-12-07 MED ORDER — PHENYLEPHRINE HCL (PRESSORS) 10 MG/ML IV SOLN
INTRAVENOUS | Status: DC | PRN
  Administered 2021-12-07: 40 ug via INTRAVENOUS
  Administered 2021-12-07: 120 ug via INTRAVENOUS
  Administered 2021-12-07: 80 ug via INTRAVENOUS

## 2021-12-07 SURGICAL SUPPLY — 69 items
AID PSTN UNV HD RSTRNT DISP (MISCELLANEOUS) ×1
ANCH SUT 2 FBRTK KNTLS 1.8 (Anchor) ×2 IMPLANT
ANCH SUT 2.6 FBRTK 1.7 (Anchor) ×2 IMPLANT
ANCH SUT PUSHLCK 19.5X3.5 STRL (Anchor) ×2 IMPLANT
ANCH SUT SWLK 19.1X4.75 (Anchor) ×2 IMPLANT
ANCHOR FIBERTAK RC 2.6 (BLUE) (Anchor) ×2 IMPLANT
ANCHOR PUSHLOCK PEEK 3.5X19.5 (Anchor) ×2 IMPLANT
ANCHOR SUT 1.8 FIBERTAK SB KL (Anchor) ×2 IMPLANT
ANCHOR SUT BIO SW 4.75X19.1 (Anchor) ×2 IMPLANT
ANCHOR TENDON ABSORBABLE (Anchor) ×1 IMPLANT
APL PRP STRL LF DISP 70% ISPRP (MISCELLANEOUS) ×2
BLADE EXCALIBUR 4.0X13 (MISCELLANEOUS) ×3 IMPLANT
BURR OVAL 8 FLU 4.0X13 (MISCELLANEOUS) ×1 IMPLANT
CANNULA 5.75X71 LONG (CANNULA) ×1 IMPLANT
CANNULA PASSPORT 5 (CANNULA) IMPLANT
CANNULA PASSPORT BUTTON 10-40 (CANNULA) ×2 IMPLANT
CANNULA PASSPORT BUTTON 12 (MISCELLANEOUS) ×3 IMPLANT
CANNULA TWIST IN 8.25X7CM (CANNULA) IMPLANT
CHLORAPREP W/TINT 26 (MISCELLANEOUS) ×4 IMPLANT
COOLER ICEMAN CLASSIC (MISCELLANEOUS) ×3 IMPLANT
DIVIDER PASSPORT (MISCELLANEOUS) ×3 IMPLANT
DRAPE IMP U-DRAPE 54X76 (DRAPES) ×3 IMPLANT
DRAPE INCISE IOBAN 66X45 STRL (DRAPES) IMPLANT
DRAPE SHOULDER BEACH CHAIR (DRAPES) ×3 IMPLANT
DRSG PAD ABDOMINAL 8X10 ST (GAUZE/BANDAGES/DRESSINGS) ×3 IMPLANT
DW OUTFLOW CASSETTE/TUBE SET (MISCELLANEOUS) ×3 IMPLANT
GAUZE SPONGE 4X4 12PLY STRL (GAUZE/BANDAGES/DRESSINGS) ×3 IMPLANT
GLOVE SRG 8 PF TXTR STRL LF DI (GLOVE) ×2 IMPLANT
GLOVE SURG ENC MOIS LTX SZ6.5 (GLOVE) ×4 IMPLANT
GLOVE SURG LTX SZ8 (GLOVE) ×3 IMPLANT
GLOVE SURG UNDER POLY LF SZ6.5 (GLOVE) ×4 IMPLANT
GLOVE SURG UNDER POLY LF SZ8 (GLOVE) ×2
GOWN STRL REUS W/ TWL LRG LVL3 (GOWN DISPOSABLE) ×4 IMPLANT
GOWN STRL REUS W/TWL LRG LVL3 (GOWN DISPOSABLE) ×4
GOWN STRL REUS W/TWL XL LVL3 (GOWN DISPOSABLE) ×3 IMPLANT
GRAFT TISS 20X25 1 THK DERM (Tissue) IMPLANT
KIT SHOULDER STAB MARCO (KITS) ×3 IMPLANT
KIT STR SPEAR 1.8 FBRTK DISP (KITS) ×1 IMPLANT
LASSO 90 CVE QUICKPAS (DISPOSABLE) IMPLANT
LASSO CRESCENT QUICKPASS (SUTURE) IMPLANT
MANIFOLD NEPTUNE II (INSTRUMENTS) ×3 IMPLANT
NDL SAFETY ECLIPSE 18X1.5 (NEEDLE) ×2 IMPLANT
NDL SCORPION MULTI FIRE (NEEDLE) IMPLANT
NEEDLE HYPO 18GX1.5 SHARP (NEEDLE) ×2
NEEDLE SCORPION MULTI FIRE (NEEDLE) ×2 IMPLANT
PACK ARTHROSCOPY DSU (CUSTOM PROCEDURE TRAY) ×3 IMPLANT
PACK BASIN DAY SURGERY FS (CUSTOM PROCEDURE TRAY) ×3 IMPLANT
PAD COLD SHLDR WRAP-ON (PAD) ×3 IMPLANT
PAD ORTHO SHOULDER 7X19 LRG (SOFTGOODS) ×3 IMPLANT
PORT APPOLLO RF 90DEGREE MULTI (SURGICAL WAND) ×4 IMPLANT
RESTRAINT HEAD UNIVERSAL NS (MISCELLANEOUS) ×3 IMPLANT
SHEET MEDIUM DRAPE 40X70 STRL (DRAPES) ×3 IMPLANT
SLEEVE SCD COMPRESS KNEE MED (STOCKING) ×3 IMPLANT
SPREADER GRAFT (MISCELLANEOUS) ×1 IMPLANT
STRIP CLOSURE SKIN 1/2X4 (GAUZE/BANDAGES/DRESSINGS) ×3 IMPLANT
SUT FIBERWIRE #2 38 T-5 BLUE (SUTURE) ×2
SUT MNCRL AB 4-0 PS2 18 (SUTURE) ×3 IMPLANT
SUT PDS AB 1 CT  36 (SUTURE)
SUT PDS AB 1 CT 36 (SUTURE) IMPLANT
SUT TIGER TAPE 7 IN WHITE (SUTURE) ×1 IMPLANT
SUTURE FIBERWR #2 38 T-5 BLUE (SUTURE) IMPLANT
SUTURE TAPE TIGERLINK 1.3MM BL (SUTURE) IMPLANT
SUTURETAPE TIGERLINK 1.3MM BL (SUTURE)
SYR 5ML LL (SYRINGE) ×2 IMPLANT
TAPE FIBER 2MM 7IN #2 BLUE (SUTURE) IMPLANT
TISSUE ARTHROFLEX THICK 4 (Tissue) ×2 IMPLANT
TOWEL GREEN STERILE FF (TOWEL DISPOSABLE) ×6 IMPLANT
TUBE CONNECTING 20X1/4 (TUBING) ×3 IMPLANT
TUBING ARTHROSCOPY IRRIG 16FT (MISCELLANEOUS) ×3 IMPLANT

## 2021-12-07 NOTE — Anesthesia Procedure Notes (Signed)
Procedure Name: Intubation Date/Time: 12/07/2021 7:41 AM Performed by: Lavonia Dana, CRNA Pre-anesthesia Checklist: Patient identified, Emergency Drugs available, Suction available and Patient being monitored Patient Re-evaluated:Patient Re-evaluated prior to induction Oxygen Delivery Method: Circle system utilized Preoxygenation: Pre-oxygenation with 100% oxygen Induction Type: IV induction Ventilation: Mask ventilation without difficulty Laryngoscope Size: Mac and 4 Grade View: Grade I Tube type: Oral Tube size: 7.0 mm Number of attempts: 1 Airway Equipment and Method: Stylet and Bite block Placement Confirmation: ETT inserted through vocal cords under direct vision, positive ETCO2 and breath sounds checked- equal and bilateral Secured at: 22 cm Tube secured with: Tape Dental Injury: Teeth and Oropharynx as per pre-operative assessment

## 2021-12-07 NOTE — Transfer of Care (Addendum)
Immediate Anesthesia Transfer of Care Note  Patient: [name]  Procedure(s) Performed: SHOULDER ARTHROSCOPY WITH SUBACROMIAL DECOMPRESSION, ROTATOR CUFF REPAIR  WITH PATCH GRAFT AUTOGRAFT AND BICEP TENDON REPAIR (Right: Shoulder) ARTHROSCOPY SHOULDER DEBRIDEMENT (Right: Shoulder)  Patient Location: PACU  Anesthesia Type:GA combined with regional for post-op pain  Level of Consciousness: drowsy  Airway & Oxygen Therapy: Patient Spontanous Breathing and Patient connected to face mask oxygen  Post-op Assessment: Report given to RN and Post -op Vital signs reviewed and stable  Post vital signs: Reviewed and stable  Last Vitals:  Vitals Value Taken Time  BP 142/86 12/07/21 1029  Temp 36.5 C 12/07/21 1029  Pulse 69 12/07/21 1029  Resp 20 12/07/21 1029  SpO2 96 % 12/07/21 1029    Last Pain:  Vitals:   12/07/21 1029  TempSrc: Oral  PainSc: 0-No pain         Complications: No notable events documented.

## 2021-12-07 NOTE — Interval H&P Note (Signed)
All questions answered, patient wants to proceed with procedure. ? ?

## 2021-12-07 NOTE — Discharge Instructions (Addendum)
Ophelia Charter MD, MPH Noemi Chapel, PA-C [place] [address], [address] [phone] (tel)   [phone] (fax)   POST-OPERATIVE INSTRUCTIONS - SHOULDER ARTHROSCOPY  WOUND CARE You may remove the Operative Dressing on Post-Op Day #3 (72hrs after surgery).   Alternatively if you would like you can leave dressing on until follow-up if within 7-8 days but keep it dry. Leave steri-strips in place until they fall off on their own, usually 2 weeks postop. There may be a small amount of fluid/bleeding leaking at the surgical site.  This is normal; the shoulder is filled with fluid during the procedure and can leak for 24-48hrs after surgery.  You may change/reinforce the bandage as needed.  Use the Cryocuff or Ice as often as possible for the first 7 days, then as needed for pain relief. Always keep a towel, ACE wrap or other barrier between the cooling unit and your skin.  You may shower on Post-Op Day #3. Gently pat the area dry. Do not soak the shoulder in water or submerge it. Keep incisions as dry as possible. Do not go swimming in the pool or ocean until 4 weeks after surgery or when otherwise instructed.    EXERCISES/BRACING Sling should be used at all times until follow-up. (Including when you sleep!) You can remove sling for hygiene ONLY.    Please continue to ambulate and do not stay sitting or lying for too long. Perform foot and wrist pumps to assist in circulation.  POST-OP MEDICATIONS- Multimodal approach to pain control In general your pain will be controlled with a combination of substances.  Prescriptions unless otherwise discussed are electronically sent to your pharmacy.  This is a carefully made plan we use to minimize narcotic use.     Celebrex - Anti-inflammatory medication taken on a scheduled basis Acetaminophen - Non-narcotic pain medicine taken on a scheduled basis  Gabapentin - this is a non-narcotic pain medication to help with nerve  based pain, take on a scheduled basis Oxycodone - This is a strong narcotic, to be used only on an as needed basis for SEVERE pain. Take one tablet every 6 hours as needed only for severe/breakthrough pain not controlled by your daily pain prescription Zofran - take as needed for nausea   FOLLOW-UP If you develop a Fever (?101.5), Redness or Drainage from the surgical incision site, please call our office to arrange for an evaluation. Please call the office to schedule a follow-up appointment for your first post-operative appointment, 7-10 days post-operatively.    HELPFUL INFORMATION  If you had a block, it will wear off between 8-24 hrs postop typically.  This is period when your pain may go from nearly zero to the pain you would have had postop without the block.  This is an abrupt transition but nothing dangerous is happening.  You may take an extra dose of narcotic when this happens.  You may be more comfortable sleeping in a semi-seated position the first few nights following surgery.  Keep a pillow propped under the elbow and forearm for comfort.  If you have a recliner type of chair it might be beneficial.  If not that is fine too, but it would be helpful to sleep propped up with pillows behind your operated shoulder as well under your elbow and forearm.  This will reduce pulling on the suture lines.  When dressing, put your operative arm in the sleeve first.  When getting undressed, take your operative arm out last.  Loose fitting, button-down shirts are recommended.  Often in the first days after surgery you may be more comfortable keeping your operative arm under your shirt and not through the sleeve.  You may return to work/school in the next couple of days when you feel up to it.  Desk work and typing in the sling is fine.  We suggest you use the pain medication the first night prior to going to bed, in order to ease any pain when the anesthesia wears off. You should avoid taking  pain medications on an empty stomach as it will make you nauseous.  You should wean off your narcotic medicines as soon as you are able.  Most patients will be off or using minimal narcotics before their first postop appointment.   Do not drink alcoholic beverages or take illicit drugs when taking pain medications.  It is against the law to drive while taking narcotics.  In some states it is against the law to drive while your arm is in a sling.   Pain medication may make you constipated.  Below are a few solutions to try in this order: Decrease the amount of pain medication if you aren't having pain. Drink lots of decaffeinated fluids. Drink prune juice and/or eat dried prunes  If the first 3 don't work start with additional solutions Take Colace - an over-the-counter stool softener Take Senokot - an over-the-counter laxative Take Miralax - a stronger over-the-counter laxative  For more information including helpful videos and documents visit our website:   https://[email or web address]-information.[email or web address]   May take Tylenol after 1:30 pm if needed.    Post Anesthesia Home Care Instructions  Activity: Get plenty of rest for the remainder of the day. A responsible individual must stay with you for 24 hours following the procedure.  For the next 24 hours, DO NOT: -Drive a car -Paediatric nurse -Drink alcoholic beverages -Take any medication unless instructed by your physician -Make any legal decisions or sign important papers.  Meals: Start with liquid foods such as gelatin or soup. Progress to regular foods as tolerated. Avoid greasy, spicy, heavy foods. If nausea and/or vomiting occur, drink only clear liquids until the nausea and/or vomiting subsides. Call your physician if vomiting continues.  Special Instructions/Symptoms: Your throat may feel dry or sore from the anesthesia or the breathing tube placed in your throat during surgery. If this causes discomfort, gargle  with warm salt water. The discomfort should disappear within 24 hours.  If you had a scopolamine patch placed behind your ear for the management of post- operative nausea and/or vomiting:  1. The medication in the patch is effective for 72 hours, after which it should be removed.  Wrap patch in a tissue and discard in the trash. Wash hands thoroughly with soap and water. 2. You may remove the patch earlier than 72 hours if you experience unpleasant side effects which may include dry mouth, dizziness or visual disturbances. 3. Avoid touching the patch. Wash your hands with soap and water after contact with the patch.    Regional Anesthesia Blocks  1. Numbness or the inability to move the "blocked" extremity may last from 3-48 hours after placement. The length of time depends on the medication injected and your individual response to the medication. If the numbness is not going away after 48 hours, call your surgeon.  2. The extremity that is blocked will need to be protected until the numbness is gone and the  Strength has returned. Because you cannot feel  it, you will need to take extra care to avoid injury. Because it may be weak, you may have difficulty moving it or using it. You may not know what position it is in without looking at it while the block is in effect.  3. For blocks in the legs and feet, returning to weight bearing and walking needs to be done carefully. You will need to wait until the numbness is entirely gone and the strength has returned. You should be able to move your leg and foot normally before you try and bear weight or walk. You will need someone to be with you when you first try to ensure you do not fall and possibly risk injury.  4. Bruising and tenderness at the needle site are common side effects and will resolve in a few days.  5. Persistent numbness or new problems with movement should be communicated to the surgeon or the [facility] [phone]  [place] [phone]). Information for Discharge Teaching: EXPAREL (bupivacaine liposome injectable suspension)   Your surgeon or anesthesiologist gave you EXPAREL(bupivacaine) to help control your pain after surgery.  EXPAREL is a local anesthetic that provides pain relief by numbing the tissue around the surgical site. EXPAREL is designed to release pain medication over time and can control pain for up to 72 hours. Depending on how you respond to EXPAREL, you may require less pain medication during your recovery.  Possible side effects: Temporary loss of sensation or ability to move in the area where bupivacaine was injected. Nausea, vomiting, constipation Rarely, numbness and tingling in your mouth or lips, lightheadedness, or anxiety may occur. Call your doctor right away if you think you may be experiencing any of these sensations, or if you have other questions regarding possible side effects.  Follow all other discharge instructions given to you by your surgeon or nurse. Eat a healthy diet and drink plenty of water or other fluids.  If you return to the hospital for any reason within 96 hours following the administration of EXPAREL, it is important for health care providers to know that you have received this anesthetic. A teal colored band has been placed on your arm with the date, time and amount of EXPAREL you have received in order to alert and inform your health care providers. Please leave this armband in place for the full 96 hours following administration, and then you may remove the band.   DeRoyal Abductor sling instructions and diagram: (Blue ball)  Youtube video: https://youtu.be/[email or web address]  Please contact your surgeon's office if you have any questions about this shoulder immobilizer.

## 2021-12-07 NOTE — Progress Notes (Signed)
Assisted Dr. Daiva Huge with right, ultrasound guided, interscalene  block. Side rails up, monitors on throughout procedure. See vital signs in flow sheet. Tolerated Procedure well.

## 2021-12-07 NOTE — Anesthesia Procedure Notes (Signed)
Anesthesia Regional Block: Interscalene brachial plexus block   Pre-Anesthetic Checklist: , timeout performed,  Correct Patient, Correct Site, Correct Laterality,  Correct Procedure, Correct Position, site marked,  Risks and benefits discussed,  Pre-op evaluation,  At surgeon's request and post-op pain management  Laterality: Right  Prep: Maximum Sterile Barrier Precautions used, chloraprep       Needles:  Injection technique: Single-shot  Needle Type: Echogenic Stimulator Needle     Needle Length: 4cm  Needle Gauge: 22     Additional Needles:   Procedures:,,,, ultrasound used (permanent image in chart),,    Narrative:  Start time: 12/07/2021 7:14 AM End time: 12/07/2021 7:17 AM Injection made incrementally with aspirations every 5 mL.  Performed by: Personally  Anesthesiologist: Brennan Bailey, MD  Additional Notes: Risks, benefits, and alternative discussed. Patient gave consent for procedure. Patient prepped and draped in sterile fashion. Sedation administered, patient remains easily responsive to voice. Relevant anatomy identified with ultrasound guidance. Local anesthetic given in 5cc increments with no signs or symptoms of intravascular injection. No pain or paraesthesias with injection. Patient monitored throughout procedure with signs of LAST or immediate complications. Tolerated well. Ultrasound image placed in chart.  Tawny Asal, MD

## 2021-12-07 NOTE — Op Note (Addendum)
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, SLAP tear and impingement  Procedure: Arthroscopic extensive debridement - Debrided areas: Bone, capsule, labrum and rotator cuff Arthroscopic subacromial decompression Arthroscopic rotator cuff repair Arthroscopic biceps tenodesis Arthroscopic rotator cuff repair augmentation with allograft cuff mend device   Operative Finding Exam under anesthesia: Full motion no limitation no instability Articular space: No loose bodies, capsule thickened anteriorly and labrum frayed anteriorly and superiorly debrided back Chondral surfaces:Intact, no sign of chondral degeneration on the glenoid or humeral head Biceps: Type II SLAP tear with significant intrasubstance tearing Subscapularis: Normal Superior Cuff: Full-thickness supraspinatus tear with a undersurface leaflet that was quite retracted to the level of the glenoid.  We spent a great deal of time mobilizing this tissue using retraction stitches to try and allow the undersurface tissue to also mobilize.  Bursal side: As above, tissue was able to be mobilized to the footprint however we medialized the footprint 3 to 5 mm in order to achieve less tension on the repair.  We used a cuff mend graft to augment the repair.  Successful completion of the planned procedure.  Patient's tear was quite retracted but we did not feel that a superior capsular reconstruction be warranted as she did still have some mobility of the superficial leaflet of her tissue.  We were able to mobilize the undersurface leaflet of the cuff tear and with medialization of the footprint had good fixation and good tissue tension.  We did feel that augmenting the biology would be appropriate and we used a cuff mend graft from [employer] to try and allow better biology for healing.  All this said the patient is  at higher than normal risk of recurrent tear and we will delay therapy.   Post-operative plan: The patient will be non-weightbearing in a sling for 6 weeks with therapy to start after 4 weeks for motion the sling training can happen before.  The patient will be discharged home.  DVT prophylaxis not indicated in ambulatory upper extremity patient without known risk factors.   Pain control with PRN pain medication preferring oral medicines.  Follow up plan will be scheduled in approximately 7 days for incision check and XR.  Post-Op Diagnosis: Same Surgeons:Primary: Hiram Gash, MD Assistants:Caroline McBane PA-C Location: [facility] OR ROOM 6 Anesthesia: General with Exparel interscalene block Antibiotics: Ancef 2 g Tourniquet time: None Estimated Blood Loss: Minimal Complications: None Specimens: None Implants: Implant Name Type Inv. Item Serial No. Manufacturer Lot No. LRB No. Used Action  ANCHOR FIBERTAK RC 2.6 [facility]) - [id number] Anchor ANCHOR FIBERTAK RC 2.6 [facility])  [employer] [id number] Right 1 Implanted  ANCHOR FIBERTAK RC 2.6 [facility]) - [id number] Anchor ANCHOR FIBERTAK RC 2.6 [facility])  [employer] [id number] Right 1 Implanted  ANCHOR SUT BIO SW 4.75X19.1 - [id number] Anchor ANCHOR SUT BIO SW 4.75X19.1  [employer] [id number] Right 1 Implanted  ANCHOR SUT BIO SW 4.75X19.1 - [id number] Anchor ANCHOR SUT BIO SW 4.75X19.1  [employer] [id number] Right 1 Implanted  ANCHOR SUT 1.8 FIBERTAK SB KL - [id number] Anchor ANCHOR SUT 1.8 FIBERTAK SB KL  [employer] [id number] Right 1 Implanted  ANCHOR SUT 1.8 FIBERTAK SB KL - [id number] Anchor ANCHOR SUT 1.8 FIBERTAK SB KL  [employer] [id number] Right 1 Implanted  TISSUE ARTHROFLEX THICK 4 - [id number] Tissue TISSUE ARTHROFLEX THICK 4 [id number] [place] [id number] Right 1 Implanted  suture  anchor pushlock Anchor   [employer] [id number] Right 1 Implanted  suture anchor pushlock  Anchor   [employer] [id number] Right 1 Implanted    Indications for Surgery:    KONSTANTINA NACHREINER is a 66 y.o. female with continued shoulder pain refractory to nonoperative measures for extended period of time.  MRI demonstrated a near irreparable tear but the patient had reasonable function.  Pain was the primary complaint.  The risks and benefits were explained at length including but not limited to continued pain, cuff failure, biceps tenodesis failure, stiffness, need for further surgery and infection.   Procedure:   Patient was correctly identified in the preoperative holding area and operative site marked.  Patient brought to OR and positioned beachchair on an Ona table ensuring that all bony prominences were padded and the head was in an appropriate location.  Anesthesia was induced and the operative shoulder was prepped and draped in the usual sterile fashion.  Timeout was called preincision.  A standard posterior viewing portal was made after localizing the portal with a spinal needle.  An anterior accessory portal was also made.  After clearing the articular space the camera was positioned in the subacromial space.  Findings above.    Extensive debridement was performed of the anterior interval tissue, labral fraying and the bursa.  Subacromial decompression: We made a lateral portal with spinal needle guidance. We then proceeded to debride bursal tissue extensively with a shaver and arthrocare device. At that point we continued to identify the borders of the acromion and identify the spur. We then carefully preserved the deltoid fascia and used a burr to convert the acromion to a Type 1 flat acromion without issue.  Biceps tenodesis: We marked the tendon and then performed a tenotomy and debridement of the stump in the articular space. We then identified the biceps tendon in its groove suprapec with the arthroscope in the lateral portal taking care to move from lateral to medial to avoid injury to the subscapularis. At that point we unroofed the tendon itself and  mobilized it. An accessory anterior portal was made in line with the tendon and we grasped it from the anterior superior portal and worked from the accessory anterior portal. Two Fibertak 1.29mm knotless anchors were placed in the groove and the tendon was secured in a luggage loop style fashion with a pass of the limb of suture through the tendon using a scorpion device to avoid pull-through.  Repair was completed with good tension on the tendon.  Residual stump of the tendon was removed after being resected with a RF ablator.  Rotator cuff repair: Patient had an undersurface leaflet of her rotator cuff that was quite retracted to the level of the glenoid.  We placed stay stitches in the form of a horizontal mattress stitch in this tissue and used an elevator to mobilize the tissue taking care to mobilize the undersurface significantly.  There was still some tension on the tissue and we felt that medialization of the tuberosity would help.  We medialized the tuberosity with a bur decorticating and obtaining a bleeding surface for healing about 3 to 5 mm.  Once this was complete we were able to place two 2.6 fiber tack anchors at the medial articular margin.  These obtain good purchase.  We then were able to shuttle the sutures through the tissue in standard fashion with a scorpion device.  We are able to fire the knotless mechanism of these anchors to perform a  medial tiedown to try and obtain medial compression of the tissue to bone.  We held the tissue reduced while we perform this.  Once this was complete we took the lateral row 4.75 swivel locks and loaded them with the tapes from the medial row anchors as well as the tension stitch from the undersurface tissue.  We obtained good fixation with this as well.  As the cuff was relatively retracted we felt that augmenting the patient's biologic conditions would potentially lead to a low risk of retear.  Based on this we used the Arthrex cuff mend system to place  an allograft.  This human dermal allograft was inserted through a lateral portal and we used the Arthrex cuff mend staple gun system to secure medial fixation of the tissue to the cuff.  We then brought the lateral edge of the tissue over the lateral border of the repair and fixated into the humerus using 2 push lock anchors.  We had secure fixation of the graft to the tissue.  The incisions were closed with absorbable monocryl and steri strips.  A sterile dressing was placed along with a sling. The patient was awoken from general anesthesia and taken to the PACU in stable condition without complication.   Noemi Chapel, PA-C, present and scrubbed throughout the case, critical for completion in a timely fashion, and for retraction, instrumentation, closure.

## 2021-12-07 NOTE — Anesthesia Preprocedure Evaluation (Addendum)
Anesthesia Evaluation  Patient identified by MRN, date of birth, ID band Patient awake    Reviewed: Allergy & Precautions, NPO status , Patient's Chart, lab work & pertinent test results  History of Anesthesia Complications Negative for: history of anesthetic complications  Airway Mallampati: II  TM Distance: >3 FB Neck ROM: Full    Dental no notable dental hx.    Pulmonary neg pulmonary ROS,    Pulmonary exam normal        Cardiovascular negative cardio ROS Normal cardiovascular exam     Neuro/Psych negative neurological ROS  negative psych ROS   GI/Hepatic negative GI ROS, Neg liver ROS,   Endo/Other  Hypothyroidism   Renal/GU negative Renal ROS  negative genitourinary   Musculoskeletal  (+) Arthritis ,   Abdominal   Peds  Hematology negative hematology ROS (+)   Anesthesia Other Findings Day of surgery medications reviewed with patient.  Reproductive/Obstetrics negative OB ROS                            Anesthesia Physical Anesthesia Plan  ASA: 2  Anesthesia Plan: General   Post-op Pain Management: Tylenol PO (pre-op) and Regional block   Induction: Intravenous  PONV Risk Score and Plan: 3 and Treatment may vary due to age or medical condition, Ondansetron, Dexamethasone and Midazolam  Airway Management Planned: Oral ETT  Additional Equipment: None  Intra-op Plan:   Post-operative Plan: Extubation in OR  Informed Consent: I have reviewed the patients History and Physical, chart, labs and discussed the procedure including the risks, benefits and alternatives for the proposed anesthesia with the patient or authorized representative who has indicated his/her understanding and acceptance.     Dental advisory given  Plan Discussed with: CRNA  Anesthesia Plan Comments:        Anesthesia Quick Evaluation

## 2021-12-07 NOTE — Anesthesia Postprocedure Evaluation (Signed)
Anesthesia Post Note  Patient: [name]  Procedure(s) Performed: SHOULDER ARTHROSCOPY WITH SUBACROMIAL DECOMPRESSION, ROTATOR CUFF REPAIR  WITH PATCH GRAFT AUTOGRAFT AND BICEP TENDON REPAIR (Right: Shoulder) ARTHROSCOPY SHOULDER DEBRIDEMENT (Right: Shoulder)     Patient location during evaluation: PACU Anesthesia Type: General Level of consciousness: awake and alert and oriented Pain management: pain level controlled Vital Signs Assessment: post-procedure vital signs reviewed and stable Respiratory status: spontaneous breathing, nonlabored ventilation and respiratory function stable Cardiovascular status: blood pressure returned to baseline Postop Assessment: no apparent nausea or vomiting Anesthetic complications: no   No notable events documented.  Last Vitals:  Vitals:   12/07/21 0945 12/07/21 1000  BP: 122/77 133/80  Pulse: 64 64  Resp: (!) 22 13  Temp:    SpO2: 92% 96%    Last Pain:  Vitals:   12/07/21 1000  TempSrc:   PainSc: 0-No pain                 Marthenia Rolling

## 2021-12-08 ENCOUNTER — Encounter (HOSPITAL_BASED_OUTPATIENT_CLINIC_OR_DEPARTMENT_OTHER): Payer: Self-pay | Admitting: Orthopaedic Surgery

## 2022-03-13 ENCOUNTER — Other Ambulatory Visit: Payer: Self-pay | Admitting: Internal Medicine

## 2022-03-13 DIAGNOSIS — Z1231 Encounter for screening mammogram for malignant neoplasm of breast: Secondary | ICD-10-CM

## 2022-07-19 ENCOUNTER — Ambulatory Visit (INDEPENDENT_AMBULATORY_CARE_PROVIDER_SITE_OTHER): Payer: Commercial Managed Care - PPO

## 2022-07-19 ENCOUNTER — Ambulatory Visit (INDEPENDENT_AMBULATORY_CARE_PROVIDER_SITE_OTHER): Payer: Commercial Managed Care - PPO | Admitting: Orthopedic Surgery

## 2022-07-19 DIAGNOSIS — M79671 Pain in right foot: Secondary | ICD-10-CM

## 2022-08-20 ENCOUNTER — Encounter: Payer: Self-pay | Admitting: Orthopedic Surgery

## 2022-08-20 NOTE — Progress Notes (Signed)
Office Visit Note   Patient: [name]           Date of Birth: 30-May-1956           MRN: [id number] Visit Date: 07/19/2022              Requested by: Idelle Crouch, MD [facility] [address],  [place] [id number] PCP: Idelle Crouch, MD  Chief Complaint  Patient presents with   Right Foot - Pain      HPI: Patient is a 66 year old woman who is status post right ankle fusion with Dr. Cyril Mourning.  Patient states that the end of May she was coming down the attic ladder when she has missed a step and rolled her foot she has pain and swelling.  X-rays at that time were negative for fracture.  Assessment & Plan: Visit Diagnoses:  1. Pain in right foot     Plan:  Increase activities as tolerated.  Follow-Up Instructions: Return if symptoms worsen or fail to improve.   Ortho Exam  Patient is alert, oriented, no adenopathy, well-dressed, normal affect, normal respiratory effort. Examination patient has a palpable pulse she has an ankle fused in slight plantarflexion she has pain-free subtalar motion.  She does have tenderness to palpation over the sinus Tarsi.  The posterior tibial tendon and peroneal tendons are nontender to palpation.  Patient has some tenderness on the plantar aspect of her calcaneus with some plantar scar tissue the morning  Imaging: No results found. No images are attached to the encounter.  Labs: No results found for: "HGBA1C", "ESRSEDRATE", "CRP", "LABURIC", "REPTSTATUS", "GRAMSTAIN", "CULT", "LABORGA"   Lab Results  Component Value Date   ALBUMIN 3.8 09/02/2011    No results found for: "MG" No results found for: "VD25OH"  No results found for: "PREALBUMIN"    Latest Ref Rng & Units 10/31/2016   10:40 AM 09/03/2011    6:05 AM 09/02/2011    1:15 AM  CBC EXTENDED  WBC 3.6 - 11.0 K/uL 4.5  9.0    RBC 3.80 - 5.20 MIL/uL 4.41  3.25    Hemoglobin 12.0 - 16.0 g/dL 13.6  9.7  13.6   HCT 35.0 - 47.0 % 40.3  28.9  40.0    Platelets 150 - 440 K/uL 225  160       There is no height or weight on file to calculate BMI.  Orders:  Orders Placed This Encounter  Procedures   XR Foot 2 Views Right   No orders of the defined types were placed in this encounter.    Procedures: No procedures performed  Clinical Data: No additional findings.  ROS:  All other systems negative, except as noted in the HPI. Review of Systems  Objective: Vital Signs: There were no vitals taken for this visit.  Specialty Comments:  No specialty comments available.  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   Menopause 07/27/2015   Status post endometrial ablation 07/27/2015   Chronic pain 07/27/2015   Adaptive colitis 07/27/2015   Disease of thyroid gland 07/27/2015   Neuritis or radiculitis due to rupture of lumbar intervertebral disc 07/13/2014   Lumbar canal stenosis 07/13/2014   Abnormality of gait 11/27/2013   Past Medical History:  Diagnosis Date   Abnormality of gait 11/27/2013   Arthritis    Left shoulder  Basal cell carcinoma    Nose, x2   Chronic back pain    and neck pain   Concussion    Dyspareunia    Environmental allergies    Herpes    History of subdural hematoma (post traumatic)    Right temporal   Hypothyroid    Irritable bowel    Menopausal state    Skin cancer    Skull fracture ([place])     Family History  Problem Relation Age of Onset   Heart failure Mother    Hypertension Mother    Diabetes Mother    Prostate cancer Father    Heart Problems Father    Heart attack Brother    Breast cancer Paternal Grandmother 42   Cancer Neg Hx     Past Surgical History:  Procedure Laterality Date   BACK SURGERY  2012   rods in back   BREAST CYST ASPIRATION Left 1990   CARPAL [place]     abd   FRACTURE  SURGERY Left 2007   plate in left, right ankle was fused   HYSTEROSCOPY WITH D & C N/A 11/09/2016   Procedure: DILATATION AND CURETTAGE /HYSTEROSCOPY;  Surgeon: Benjaman Kindler, MD;  Location: [facility] OR[facility];  Service: Gynecology;  Laterality: N/A;   HYSTEROSCOPY WITH NOVASURE  2005   LAPAROSCOPY N/A 11/09/2016   Procedure: LAPAROSCOPY DIAGNOSTIC;  Surgeon: Benjaman Kindler, MD;  Location: [facility] OR[facility];  Service: Gynecology;  Laterality: N/A;   lumbosacral spine fusion     L1 compression fracture, fusion at T11-L3   ORIF ANKLE FRACTURE     Bilateral   SHOULDER ARTHROSCOPY Right 12/07/2021   Procedure: ARTHROSCOPY SHOULDER DEBRIDEMENT;  Surgeon: Hiram Gash, MD;  Location: [facility];  Service: Orthopedics;  Laterality: Right;   SHOULDER ARTHROSCOPY WITH SUBACROMIAL DECOMPRESSION, ROTATOR CUFF REPAIR AND BICEP TENDON REPAIR Right 12/07/2021   Procedure: SHOULDER ARTHROSCOPY WITH SUBACROMIAL DECOMPRESSION, ROTATOR CUFF REPAIR  WITH PATCH GRAFT AUTOGRAFT AND BICEP TENDON REPAIR;  Surgeon: Hiram Gash, MD;  Location: [facility];  Service: Orthopedics;  Laterality: Right;   TENDON REPAIR Left    Left forearm   TUBAL LIGATION  1992   Social History   Occupational History    Comment: part time/ self -employed   Tobacco Use   Smoking status: Never   Smokeless tobacco: Never  Vaping Use   Vaping Use: Never used  Substance and Sexual Activity   Alcohol use: Yes    Comment: rarely   Drug use: No   Sexual activity: Yes    Birth control/protection: Surgical

## 2023-05-28 ENCOUNTER — Other Ambulatory Visit: Payer: Self-pay | Admitting: Orthopaedic Surgery

## 2023-05-28 DIAGNOSIS — Z01818 Encounter for other preprocedural examination: Secondary | ICD-10-CM

## 2023-07-12 ENCOUNTER — Other Ambulatory Visit: Payer: Self-pay | Admitting: Orthopedic Surgery

## 2023-07-12 ENCOUNTER — Ambulatory Visit
Admission: RE | Admit: 2023-07-12 | Discharge: 2023-07-12 | Disposition: A | Discharge status: Home / Self Care | Payer: Commercial Managed Care - PPO | Source: Ambulatory Visit | Attending: Orthopedic Surgery | Admitting: Orthopedic Surgery

## 2023-07-12 DIAGNOSIS — M47892 Other spondylosis, cervical region: Secondary | ICD-10-CM

## 2023-07-12 DIAGNOSIS — M50221 Other cervical disc displacement at C4-C5 level: Secondary | ICD-10-CM

## 2023-07-12 DIAGNOSIS — M4802 Spinal stenosis, cervical region: Secondary | ICD-10-CM

## 2023-07-12 DIAGNOSIS — G9589 Other specified diseases of spinal cord: Secondary | ICD-10-CM

## 2023-08-13 ENCOUNTER — Other Ambulatory Visit: Payer: Self-pay | Admitting: Internal Medicine

## 2023-08-13 DIAGNOSIS — Z1231 Encounter for screening mammogram for malignant neoplasm of breast: Secondary | ICD-10-CM

## 2023-10-03 ENCOUNTER — Ambulatory Visit
Admission: RE | Admit: 2023-10-03 | Discharge: 2023-10-03 | Disposition: A | Discharge status: Home / Self Care | Payer: Commercial Managed Care - PPO | Source: Ambulatory Visit | Attending: Internal Medicine | Admitting: Internal Medicine

## 2023-10-03 DIAGNOSIS — Z1231 Encounter for screening mammogram for malignant neoplasm of breast: Secondary | ICD-10-CM | POA: Insufficient documentation

## 2023-11-28 ENCOUNTER — Encounter: Payer: Self-pay | Admitting: Orthopaedic Surgery

## 2023-12-05 ENCOUNTER — Telehealth: Payer: Commercial Managed Care - PPO | Admitting: Nurse Practitioner

## 2023-12-05 DIAGNOSIS — J0140 Acute pansinusitis, unspecified: Secondary | ICD-10-CM | POA: Diagnosis not present

## 2023-12-05 DIAGNOSIS — U071 COVID-19: Secondary | ICD-10-CM | POA: Diagnosis not present

## 2023-12-05 MED ORDER — AMOXICILLIN-POT CLAVULANATE 875-125 MG PO TABS: 1.0000 | ORAL_TABLET | Freq: Two times a day (BID) | ORAL | 0 refills | Status: AC

## 2023-12-05 NOTE — Progress Notes (Signed)
 Virtual Visit Consent   [name], you are scheduled for a virtual visit with a [facility] provider today. Just as with appointments in the office, your consent must be obtained to participate. Your consent will be active for this visit and any virtual visit you may have with one of our providers in the next 365 days. If you have a MyChart account, a copy of this consent can be sent to you electronically.  As this is a virtual visit, video technology does not allow for your provider to perform a traditional examination. This may limit your provider's ability to fully assess your condition. If your provider identifies any concerns that need to be evaluated in person or the need to arrange testing (such as labs, EKG, etc.), we will make arrangements to do so. Although advances in technology are sophisticated, we cannot ensure that it will always work on either your end or our end. If the connection with a video visit is poor, the visit may have to be switched to a telephone visit. With either a video or telephone visit, we are not always able to ensure that we have a secure connection.  By engaging in this virtual visit, you consent to the provision of healthcare and authorize for your insurance to be billed (if applicable) for the services provided during this visit. Depending on your insurance coverage, you may receive a charge related to this service.  I need to obtain your verbal consent now. Are you willing to proceed with your visit today? [name] has provided verbal consent on 12/05/2023 for a virtual visit (video or telephone). Lauraine Kitty, FNP  Date: 12/05/2023 8:43 AM  Virtual Visit via Video Note   I, Lauraine Kitty, connected with  [name]  ([id number], [birth year]) on 12/05/23 at  9:00 AM EST by a video-enabled telemedicine application and verified that I am speaking with the correct person using two identifiers.  Location: Patient: Virtual Visit Location Patient:  Home Provider: Virtual Visit Location Provider: Home Office   I discussed the limitations of evaluation and management by telemedicine and the availability of in person appointments. The patient expressed understanding and agreed to proceed.    History of Present Illness: [name] is a 68 y.o. 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  Doxycycline is okay   Medications:  Current Outpatient  Medications:    amitriptyline  (ELAVIL ) 25 MG tablet, Take 25 mg by mouth at bedtime., Disp: , Rfl:    docusate sodium  (COLACE) 100 MG capsule, Take 1 capsule (100 mg total) by mouth 2 (two) times daily. To keep stools soft, Disp: 30 capsule, Rfl: 0   DULoxetine (CYMBALTA) 20 MG capsule, Take 20 mg by mouth daily. , Disp: , Rfl:    fluticasone (FLONASE) 50 MCG/ACT nasal spray, Place 1 spray into both nostrils daily as needed for allergies or rhinitis.,  Disp: , Rfl:    levothyroxine  (SYNTHROID , LEVOTHROID) 50 MCG tablet, Take 50 mcg by mouth daily before breakfast., Disp: , Rfl:    methocarbamol  (ROBAXIN ) 750 MG tablet, Take 750 mg by mouth 3 (three) times daily. , Disp: , Rfl:    MOVANTIK 25 MG TABS tablet, Take 25 mg by mouth daily. , Disp: , Rfl:    Multiple Vitamins-Minerals (MULTIVITAMIN WITH MINERALS) tablet, Take 1 tablet by mouth daily., Disp: , Rfl:    Oxycodone  HCl 10 MG TABS, Take 10 mg by mouth 2 (two) times daily as needed (pain). , Disp: , Rfl:    PRESCRIPTION MEDICATION, Apply 1-2 g topically 4 (four) times daily as needed (pain). Apply to left hand for pain. Depends on pain level how much and how often patient uses.  Compounded by [employer] in [place], [place] 3% Diclofenac Lidocaine  Cyclobenzoprine, Disp: , Rfl:    valACYclovir (VALTREX) 1000 MG tablet, Take 1,000 mg by mouth daily as needed (outbreaks). , Disp: , Rfl:   Observations/Objective: Patient is well-developed, well-nourished in no acute distress.  Resting comfortably  at home.  Head is normocephalic, atraumatic.  No labored breathing.  Speech is clear and coherent with logical content.  Patient is alert and oriented at baseline.    Assessment and Plan:  1. Acute non-recurrent pansinusitis (Primary) Advised pushing fluids and increasing protein in diet  Continue immune support  Switch to Mucinex and continue Flonase  Take antibiotic with food:  - amoxicillin -clavulanate (AUGMENTIN ) 875-125 MG tablet; Take 1 tablet by mouth 2 (two) times daily for 7 days.  Dispense: 14 tablet; Refill: 0     Follow Up Instructions: I discussed the assessment and treatment plan with the patient. The patient was provided an opportunity to ask questions and all were answered. The patient agreed with the plan and demonstrated an understanding of the instructions.  A copy of instructions were sent to the patient via MyChart unless otherwise noted below.    The patient was  advised to call back or seek an in-person evaluation if the symptoms worsen or if the condition fails to improve as anticipated.    Lauraine Kitty, FNP

## 2023-12-06 ENCOUNTER — Inpatient Hospital Stay: Admission: RE | Admit: 2023-12-06 | Payer: Commercial Managed Care - PPO | Source: Ambulatory Visit

## 2024-01-30 ENCOUNTER — Other Ambulatory Visit: Payer: Self-pay

## 2024-01-30 ENCOUNTER — Emergency Department (HOSPITAL_COMMUNITY): Payer: Commercial Managed Care - PPO

## 2024-01-30 ENCOUNTER — Inpatient Hospital Stay (HOSPITAL_COMMUNITY)
Admission: EM | Admit: 2024-01-30 | Discharge: 2024-02-06 | DRG: 330 | Disposition: A | Discharge status: Home / Self Care | Payer: Commercial Managed Care - PPO | Attending: General Surgery | Admitting: General Surgery

## 2024-01-30 DIAGNOSIS — Z79899 Other long term (current) drug therapy: Secondary | ICD-10-CM

## 2024-01-30 DIAGNOSIS — Z85828 Personal history of other malignant neoplasm of skin: Secondary | ICD-10-CM

## 2024-01-30 DIAGNOSIS — Z833 Family history of diabetes mellitus: Secondary | ICD-10-CM

## 2024-01-30 DIAGNOSIS — Z79890 Hormone replacement therapy (postmenopausal): Secondary | ICD-10-CM

## 2024-01-30 DIAGNOSIS — R7303 Prediabetes: Secondary | ICD-10-CM | POA: Diagnosis present

## 2024-01-30 DIAGNOSIS — K562 Volvulus: Secondary | ICD-10-CM | POA: Diagnosis not present

## 2024-01-30 DIAGNOSIS — E039 Hypothyroidism, unspecified: Secondary | ICD-10-CM | POA: Diagnosis present

## 2024-01-30 DIAGNOSIS — Z79891 Long term (current) use of opiate analgesic: Secondary | ICD-10-CM

## 2024-01-30 DIAGNOSIS — K56600 Partial intestinal obstruction, unspecified as to cause: Secondary | ICD-10-CM | POA: Diagnosis not present

## 2024-01-30 DIAGNOSIS — Z981 Arthrodesis status: Secondary | ICD-10-CM

## 2024-01-30 DIAGNOSIS — G8929 Other chronic pain: Secondary | ICD-10-CM | POA: Diagnosis present

## 2024-01-30 DIAGNOSIS — Z780 Asymptomatic menopausal state: Secondary | ICD-10-CM

## 2024-01-30 DIAGNOSIS — I10 Essential (primary) hypertension: Secondary | ICD-10-CM | POA: Diagnosis present

## 2024-01-30 DIAGNOSIS — Q438 Other specified congenital malformations of intestine: Secondary | ICD-10-CM

## 2024-01-30 DIAGNOSIS — Z87828 Personal history of other (healed) physical injury and trauma: Secondary | ICD-10-CM

## 2024-01-30 DIAGNOSIS — Z8249 Family history of ischemic heart disease and other diseases of the circulatory system: Secondary | ICD-10-CM

## 2024-01-30 DIAGNOSIS — D259 Leiomyoma of uterus, unspecified: Secondary | ICD-10-CM | POA: Diagnosis present

## 2024-01-30 LAB — CBC
HCT: 37.2 % (ref 36.0–46.0)
Hemoglobin: 12.3 g/dL (ref 12.0–15.0)
MCH: 29.9 pg (ref 26.0–34.0)
MCHC: 33.1 g/dL (ref 30.0–36.0)
MCV: 90.5 fL (ref 80.0–100.0)
Platelets: 241 10*3/uL (ref 150–400)
RBC: 4.11 MIL/uL (ref 3.87–5.11)
RDW: 14.2 % (ref 11.5–15.5)
WBC: 4.7 10*3/uL (ref 4.0–10.5)
nRBC: 0 % (ref 0.0–0.2)

## 2024-01-30 LAB — COMPREHENSIVE METABOLIC PANEL
ALT: 21 U/L (ref 0–44)
AST: 23 U/L (ref 15–41)
Albumin: 3.6 g/dL (ref 3.5–5.0)
Alkaline Phosphatase: 36 U/L — ABNORMAL LOW (ref 38–126)
Anion gap: 13 (ref 5–15)
BUN: 13 mg/dL (ref 8–23)
CO2: 25 mmol/L (ref 22–32)
Calcium: 9.1 mg/dL (ref 8.9–10.3)
Chloride: 100 mmol/L (ref 98–111)
Creatinine, Ser: 0.86 mg/dL (ref 0.44–1.00)
GFR, Estimated: >60 mL/min (ref >60)
Glucose, Bld: 94 mg/dL (ref 70–99)
Potassium: 4.2 mmol/L (ref 3.5–5.1)
Sodium: 138 mmol/L (ref 135–145)
Total Bilirubin: 0.3 mg/dL (ref 0.0–1.2)
Total Protein: 6.4 g/dL — ABNORMAL LOW (ref 6.5–8.1)

## 2024-01-30 LAB — URINALYSIS, ROUTINE W REFLEX MICROSCOPIC
Bilirubin Urine: NEGATIVE
Glucose, UA: NEGATIVE mg/dL
Hgb urine dipstick: NEGATIVE
Ketones, ur: NEGATIVE mg/dL
Leukocytes,Ua: NEGATIVE
Nitrite: NEGATIVE
Protein, ur: NEGATIVE mg/dL
Specific Gravity, Urine: 1.014 (ref 1.005–1.030)
pH: 6 (ref 5.0–8.0)

## 2024-01-30 LAB — LIPASE, BLOOD: Lipase: 27 U/L (ref 11–51)

## 2024-01-30 MED ORDER — IOHEXOL 350 MG/ML SOLN
50.0000 mL | Freq: Once | INTRAVENOUS | Status: AC | PRN
  Administered 2024-01-30: 50 mL via INTRAVENOUS

## 2024-01-30 NOTE — ED Triage Notes (Signed)
 Patient reports pain across lower and mid abdomen onset Tuesday this week with occasional nausea , no emesis or diarrhea .

## 2024-01-30 NOTE — ED Provider Triage Note (Signed)
 Emergency Medicine Provider Triage Evaluation Note  [name] , a 68 y.o. female  was evaluated in triage.  Pt complains of abdominal pain.  Reports abdominal pain beginning on Monday which was relieved yesterday before beginning and today.  Describes pain as cramping" all over."  Reports feeling distended.  Reports nausea with no emesis.  Last bowel movement yesterday and regular per patient..  Review of Systems  Positive: See above  Negative:   Physical Exam  BP 109/73 (BP Location: Right Arm)   Pulse 65   Temp 97.9 F (36.6 C) (Oral)   Resp 17   SpO2 94%  Gen:   Awake, no distress   Resp:  Normal effort  MSK:   Moves extremities without difficulty  Other:  Abdominal tenderness  Medical Decision Making  Medically screening exam initiated at 8:57 PM.  Appropriate orders placed.  [name] was informed that the remainder of the evaluation will be completed by another provider, this initial triage assessment does not replace that evaluation, and the importance of remaining in the ED until their evaluation is complete.     Peter Garter, [place] 01/30/24 [phone]

## 2024-01-31 ENCOUNTER — Emergency Department (HOSPITAL_COMMUNITY): Payer: Commercial Managed Care - PPO

## 2024-01-31 ENCOUNTER — Inpatient Hospital Stay (HOSPITAL_COMMUNITY): Payer: Commercial Managed Care - PPO | Admitting: Anesthesiology

## 2024-01-31 ENCOUNTER — Other Ambulatory Visit: Payer: Self-pay

## 2024-01-31 ENCOUNTER — Encounter (HOSPITAL_COMMUNITY): Payer: Self-pay

## 2024-01-31 ENCOUNTER — Encounter (HOSPITAL_COMMUNITY): Admission: EM | Disposition: A | Discharge status: Home / Self Care | Payer: Self-pay | Source: Home / Self Care

## 2024-01-31 DIAGNOSIS — Z780 Asymptomatic menopausal state: Secondary | ICD-10-CM | POA: Diagnosis not present

## 2024-01-31 DIAGNOSIS — K562 Volvulus: Secondary | ICD-10-CM | POA: Diagnosis present

## 2024-01-31 DIAGNOSIS — K56600 Partial intestinal obstruction, unspecified as to cause: Secondary | ICD-10-CM | POA: Diagnosis present

## 2024-01-31 DIAGNOSIS — Z87828 Personal history of other (healed) physical injury and trauma: Secondary | ICD-10-CM | POA: Diagnosis not present

## 2024-01-31 DIAGNOSIS — I10 Essential (primary) hypertension: Secondary | ICD-10-CM | POA: Diagnosis present

## 2024-01-31 DIAGNOSIS — E039 Hypothyroidism, unspecified: Secondary | ICD-10-CM | POA: Diagnosis present

## 2024-01-31 DIAGNOSIS — Z85828 Personal history of other malignant neoplasm of skin: Secondary | ICD-10-CM | POA: Diagnosis not present

## 2024-01-31 DIAGNOSIS — Z79899 Other long term (current) drug therapy: Secondary | ICD-10-CM | POA: Diagnosis not present

## 2024-01-31 DIAGNOSIS — Z8249 Family history of ischemic heart disease and other diseases of the circulatory system: Secondary | ICD-10-CM | POA: Diagnosis not present

## 2024-01-31 DIAGNOSIS — Z79890 Hormone replacement therapy (postmenopausal): Secondary | ICD-10-CM | POA: Diagnosis not present

## 2024-01-31 DIAGNOSIS — Z981 Arthrodesis status: Secondary | ICD-10-CM | POA: Diagnosis not present

## 2024-01-31 DIAGNOSIS — G8929 Other chronic pain: Secondary | ICD-10-CM | POA: Diagnosis present

## 2024-01-31 DIAGNOSIS — R7303 Prediabetes: Secondary | ICD-10-CM | POA: Diagnosis present

## 2024-01-31 DIAGNOSIS — D259 Leiomyoma of uterus, unspecified: Secondary | ICD-10-CM | POA: Diagnosis present

## 2024-01-31 DIAGNOSIS — Z833 Family history of diabetes mellitus: Secondary | ICD-10-CM | POA: Diagnosis not present

## 2024-01-31 DIAGNOSIS — Q438 Other specified congenital malformations of intestine: Secondary | ICD-10-CM | POA: Diagnosis not present

## 2024-01-31 DIAGNOSIS — Z79891 Long term (current) use of opiate analgesic: Secondary | ICD-10-CM | POA: Diagnosis not present

## 2024-01-31 HISTORY — PX: LAPAROTOMY: SHX154

## 2024-01-31 HISTORY — PX: PARTIAL COLECTOMY: SHX5273

## 2024-01-31 LAB — TYPE AND SCREEN
ABO/RH(D): O POS
Antibody Screen: NEGATIVE

## 2024-01-31 LAB — LACTIC ACID, PLASMA: Lactic Acid, Venous: 1 mmol/L (ref 0.5–1.9)

## 2024-01-31 SURGERY — LAPAROTOMY, EXPLORATORY
Anesthesia: General | Site: Abdomen

## 2024-01-31 MED ORDER — LIDOCAINE IN D5W 4-5 MG/ML-% IV SOLN
INTRAVENOUS | Status: DC | PRN
  Administered 2024-01-31: 1 mg/min via INTRAVENOUS

## 2024-01-31 MED ORDER — ARTIFICIAL TEARS OPHTHALMIC OINT
TOPICAL_OINTMENT | OPHTHALMIC | Status: AC
  Filled 2024-01-31: qty 3.5

## 2024-01-31 MED ORDER — AMITRIPTYLINE HCL 10 MG PO TABS
10.0000 mg | ORAL_TABLET | Freq: Every day | ORAL | Status: DC
  Administered 2024-01-31 – 2024-02-05 (×6): 10 mg via ORAL
  Filled 2024-01-31 (×7): qty 1

## 2024-01-31 MED ORDER — KETAMINE HCL 50 MG/5ML IJ SOSY
PREFILLED_SYRINGE | INTRAMUSCULAR | Status: AC
  Filled 2024-01-31: qty 5

## 2024-01-31 MED ORDER — DIPHENHYDRAMINE HCL 50 MG/ML IJ SOLN: 12.5000 mg | Freq: Four times a day (QID) | INTRAMUSCULAR | Status: DC | PRN

## 2024-01-31 MED ORDER — HYDROMORPHONE HCL 1 MG/ML IJ SOLN
1.0000 mg | INTRAMUSCULAR | Status: DC | PRN
  Administered 2024-01-31 – 2024-02-02 (×2): 1 mg via INTRAVENOUS
  Filled 2024-01-31 (×2): qty 1

## 2024-01-31 MED ORDER — ROCURONIUM BROMIDE 10 MG/ML (PF) SYRINGE
PREFILLED_SYRINGE | INTRAVENOUS | Status: DC | PRN
Start: 2024-01-31 — End: 2024-01-31
  Administered 2024-01-31: 30 mg via INTRAVENOUS

## 2024-01-31 MED ORDER — SODIUM CHLORIDE 0.9 % IV SOLN
2.0000 g | INTRAVENOUS | Status: AC
  Administered 2024-01-31: 2 g via INTRAVENOUS
  Filled 2024-01-31 (×2): qty 2

## 2024-01-31 MED ORDER — ONDANSETRON HCL 4 MG/2ML IJ SOLN
4.0000 mg | Freq: Four times a day (QID) | INTRAMUSCULAR | Status: DC | PRN
  Administered 2024-02-02 – 2024-02-04 (×2): 4 mg via INTRAVENOUS
  Filled 2024-01-31 (×2): qty 2

## 2024-01-31 MED ORDER — BUPIVACAINE LIPOSOME 1.3 % IJ SUSP
INTRAMUSCULAR | Status: DC | PRN
  Administered 2024-01-31: 20 mL

## 2024-01-31 MED ORDER — KETAMINE HCL 100 MG/ML IJ SOLN: INTRAMUSCULAR | Status: DC | PRN

## 2024-01-31 MED ORDER — DEXAMETHASONE SODIUM PHOSPHATE 10 MG/ML IJ SOLN
INTRAMUSCULAR | Status: AC
  Filled 2024-01-31: qty 1

## 2024-01-31 MED ORDER — ROCURONIUM BROMIDE 10 MG/ML (PF) SYRINGE
PREFILLED_SYRINGE | INTRAVENOUS | Status: AC
  Filled 2024-01-31: qty 10

## 2024-01-31 MED ORDER — LEVOTHYROXINE SODIUM 50 MCG PO TABS
50.0000 ug | ORAL_TABLET | Freq: Every day | ORAL | Status: DC
  Administered 2024-02-01 – 2024-02-06 (×6): 50 ug via ORAL
  Filled 2024-01-31 (×6): qty 1

## 2024-01-31 MED ORDER — SUCCINYLCHOLINE CHLORIDE 200 MG/10ML IV SOSY
PREFILLED_SYRINGE | INTRAVENOUS | Status: DC | PRN
  Administered 2024-01-31: 120 mg via INTRAVENOUS

## 2024-01-31 MED ORDER — ACETAMINOPHEN 650 MG RE SUPP: 650.0000 mg | Freq: Four times a day (QID) | RECTAL | Status: DC | PRN

## 2024-01-31 MED ORDER — FENTANYL CITRATE (PF) 100 MCG/2ML IJ SOLN
INTRAMUSCULAR | Status: AC
  Filled 2024-01-31: qty 2

## 2024-01-31 MED ORDER — ONDANSETRON HCL 4 MG/2ML IJ SOLN
INTRAMUSCULAR | Status: DC | PRN
  Administered 2024-01-31: 4 mg via INTRAVENOUS

## 2024-01-31 MED ORDER — PANTOPRAZOLE SODIUM 40 MG IV SOLR
40.0000 mg | Freq: Every day | INTRAVENOUS | Status: DC
  Administered 2024-01-31 – 2024-02-05 (×6): 40 mg via INTRAVENOUS
  Filled 2024-01-31 (×6): qty 10

## 2024-01-31 MED ORDER — ONDANSETRON HCL 4 MG/2ML IJ SOLN
INTRAMUSCULAR | Status: AC
  Filled 2024-01-31: qty 2

## 2024-01-31 MED ORDER — KETAMINE HCL 100 MG/ML IJ SOLN
INTRAMUSCULAR | Status: DC | PRN
  Administered 2024-01-31: 20 mg via ORAL

## 2024-01-31 MED ORDER — DIPHENHYDRAMINE HCL 12.5 MG/5ML PO ELIX: 12.5000 mg | ORAL_SOLUTION | Freq: Four times a day (QID) | ORAL | Status: DC | PRN

## 2024-01-31 MED ORDER — ACETAMINOPHEN 325 MG PO TABS
650.0000 mg | ORAL_TABLET | Freq: Four times a day (QID) | ORAL | Status: DC | PRN
  Administered 2024-01-31: 650 mg via ORAL

## 2024-01-31 MED ORDER — MIDAZOLAM HCL 2 MG/2ML IJ SOLN
INTRAMUSCULAR | Status: DC | PRN
  Administered 2024-01-31: 2 mg via INTRAVENOUS

## 2024-01-31 MED ORDER — BUPIVACAINE LIPOSOME 1.3 % IJ SUSP
INTRAMUSCULAR | Status: AC
  Filled 2024-01-31: qty 20

## 2024-01-31 MED ORDER — ONDANSETRON 4 MG PO TBDP
4.0000 mg | ORAL_TABLET | Freq: Four times a day (QID) | ORAL | Status: DC | PRN
  Filled 2024-01-31: qty 1

## 2024-01-31 MED ORDER — FENTANYL CITRATE (PF) 100 MCG/2ML IJ SOLN
25.0000 ug | INTRAMUSCULAR | Status: DC | PRN
  Administered 2024-01-31 (×2): 25 ug via INTRAVENOUS

## 2024-01-31 MED ORDER — MORPHINE SULFATE (PF) 2 MG/ML IV SOLN: 1.0000 mg | INTRAVENOUS | Status: DC | PRN

## 2024-01-31 MED ORDER — EPHEDRINE SULFATE (PRESSORS) 50 MG/ML IJ SOLN
INTRAMUSCULAR | Status: DC | PRN
  Administered 2024-01-31: 10 mg via INTRAVENOUS

## 2024-01-31 MED ORDER — MIDAZOLAM HCL 2 MG/2ML IJ SOLN
INTRAMUSCULAR | Status: AC
  Filled 2024-01-31: qty 2

## 2024-01-31 MED ORDER — FENTANYL CITRATE (PF) 250 MCG/5ML IJ SOLN
INTRAMUSCULAR | Status: DC | PRN
  Administered 2024-01-31: 100 ug via INTRAVENOUS

## 2024-01-31 MED ORDER — LACTATED RINGERS IV SOLN: INTRAVENOUS | Status: DC

## 2024-01-31 MED ORDER — ONDANSETRON HCL 4 MG/2ML IJ SOLN: 4.0000 mg | Freq: Once | INTRAMUSCULAR | Status: DC | PRN

## 2024-01-31 MED ORDER — EPHEDRINE 5 MG/ML INJ
INTRAVENOUS | Status: AC
  Filled 2024-01-31: qty 5

## 2024-01-31 MED ORDER — DEXAMETHASONE SODIUM PHOSPHATE 10 MG/ML IJ SOLN
INTRAMUSCULAR | Status: DC | PRN
Start: 2024-01-31 — End: 2024-01-31
  Administered 2024-01-31: 10 mg via INTRAVENOUS

## 2024-01-31 MED ORDER — ACETAMINOPHEN 500 MG PO TABS
1000.0000 mg | ORAL_TABLET | Freq: Four times a day (QID) | ORAL | Status: DC
Start: 2024-01-31 — End: 2024-02-06
  Administered 2024-01-31 – 2024-02-06 (×23): 1000 mg via ORAL
  Filled 2024-01-31 (×26): qty 2

## 2024-01-31 MED ORDER — METHOCARBAMOL 500 MG PO TABS
500.0000 mg | ORAL_TABLET | Freq: Four times a day (QID) | ORAL | Status: DC | PRN
  Administered 2024-01-31 – 2024-02-03 (×12): 500 mg via ORAL
  Filled 2024-01-31 (×11): qty 1

## 2024-01-31 MED ORDER — LIDOCAINE IN D5W 4-5 MG/ML-% IV SOLN
INTRAVENOUS | Status: AC
Start: 2024-01-31 — End: ?
  Filled 2024-01-31: qty 500

## 2024-01-31 MED ORDER — FENTANYL CITRATE (PF) 250 MCG/5ML IJ SOLN
INTRAMUSCULAR | Status: AC
Start: 2024-01-31 — End: ?
  Filled 2024-01-31: qty 5

## 2024-01-31 MED ORDER — ACETAMINOPHEN 10 MG/ML IV SOLN
INTRAVENOUS | Status: AC
  Filled 2024-01-31: qty 100

## 2024-01-31 MED ORDER — 0.9 % SODIUM CHLORIDE (POUR BTL) OPTIME
TOPICAL | Status: DC | PRN
  Administered 2024-01-31 (×3): 1000 mL

## 2024-01-31 MED ORDER — OXYCODONE HCL 5 MG/5ML PO SOLN: 5.0000 mg | Freq: Once | ORAL | Status: DC | PRN

## 2024-01-31 MED ORDER — KCL IN DEXTROSE-NACL 20-5-0.45 MEQ/L-%-% IV SOLN
INTRAVENOUS | Status: AC
  Filled 2024-01-31 (×3): qty 1000

## 2024-01-31 MED ORDER — LIDOCAINE 2% (20 MG/ML) 5 ML SYRINGE
INTRAMUSCULAR | Status: AC
  Filled 2024-01-31: qty 5

## 2024-01-31 MED ORDER — ACETAMINOPHEN 325 MG PO TABS
ORAL_TABLET | ORAL | Status: AC
Start: 2024-01-31 — End: 2024-02-01
  Filled 2024-01-31: qty 2

## 2024-01-31 MED ORDER — ORAL CARE MOUTH RINSE: 15.0000 mL | Freq: Once | OROMUCOSAL | Status: AC

## 2024-01-31 MED ORDER — ARTIFICIAL TEARS OPHTHALMIC OINT
TOPICAL_OINTMENT | OPHTHALMIC | Status: DC | PRN
  Administered 2024-01-31: 1 via OPHTHALMIC

## 2024-01-31 MED ORDER — PHENYLEPHRINE 80 MCG/ML (10ML) SYRINGE FOR IV PUSH (FOR BLOOD PRESSURE SUPPORT)
PREFILLED_SYRINGE | INTRAVENOUS | Status: AC
  Filled 2024-01-31: qty 10

## 2024-01-31 MED ORDER — SUGAMMADEX SODIUM 200 MG/2ML IV SOLN
INTRAVENOUS | Status: AC
  Filled 2024-01-31: qty 2

## 2024-01-31 MED ORDER — CHLORHEXIDINE GLUCONATE 0.12 % MT SOLN
OROMUCOSAL | Status: AC
  Administered 2024-01-31: 15 mL via OROMUCOSAL
  Filled 2024-01-31: qty 15

## 2024-01-31 MED ORDER — ACETAMINOPHEN 160 MG/5ML PO SOLN
ORAL | Status: AC
  Filled 2024-01-31: qty 20.3

## 2024-01-31 MED ORDER — PHENYLEPHRINE 80 MCG/ML (10ML) SYRINGE FOR IV PUSH (FOR BLOOD PRESSURE SUPPORT)
PREFILLED_SYRINGE | INTRAVENOUS | Status: DC | PRN
  Administered 2024-01-31: 160 ug via INTRAVENOUS

## 2024-01-31 MED ORDER — OXYCODONE HCL 5 MG PO TABS: 5.0000 mg | ORAL_TABLET | Freq: Once | ORAL | Status: DC | PRN

## 2024-01-31 MED ORDER — PROPOFOL 10 MG/ML IV BOLUS
INTRAVENOUS | Status: AC
  Filled 2024-01-31: qty 20

## 2024-01-31 MED ORDER — CHLORHEXIDINE GLUCONATE 0.12 % MT SOLN: 15.0000 mL | Freq: Once | OROMUCOSAL | Status: AC

## 2024-01-31 MED ORDER — METHOCARBAMOL 500 MG PO TABS
ORAL_TABLET | ORAL | Status: AC
  Filled 2024-01-31: qty 1

## 2024-01-31 MED ORDER — LIDOCAINE 2% (20 MG/ML) 5 ML SYRINGE
INTRAMUSCULAR | Status: DC | PRN
  Administered 2024-01-31: 100 mg via INTRAVENOUS

## 2024-01-31 MED ORDER — SUGAMMADEX SODIUM 200 MG/2ML IV SOLN
INTRAVENOUS | Status: DC | PRN
Start: 2024-01-31 — End: 2024-01-31
  Administered 2024-01-31: 200 mg via INTRAVENOUS

## 2024-01-31 MED ORDER — ENOXAPARIN SODIUM 40 MG/0.4ML IJ SOSY
40.0000 mg | PREFILLED_SYRINGE | INTRAMUSCULAR | Status: DC
  Administered 2024-02-01 – 2024-02-06 (×6): 40 mg via SUBCUTANEOUS
  Filled 2024-01-31 (×6): qty 0.4

## 2024-01-31 MED ORDER — HEPARIN SODIUM (PORCINE) 5000 UNIT/ML IJ SOLN
5000.0000 [IU] | Freq: Three times a day (TID) | INTRAMUSCULAR | Status: DC
  Administered 2024-01-31: 5000 [IU] via SUBCUTANEOUS
  Filled 2024-01-31: qty 1

## 2024-01-31 MED ORDER — PROPOFOL 10 MG/ML IV BOLUS
INTRAVENOUS | Status: DC | PRN
  Administered 2024-01-31: 40 mg via INTRAVENOUS
  Administered 2024-01-31: 90 mg via INTRAVENOUS

## 2024-01-31 MED ORDER — ACETAMINOPHEN 10 MG/ML IV SOLN: 1000.0000 mg | Freq: Once | INTRAVENOUS | Status: DC | PRN

## 2024-01-31 SURGICAL SUPPLY — 44 items
BAG COUNTER SPONGE SURGICOUNT (BAG) ×2 IMPLANT
BLADE CLIPPER SURG (BLADE) IMPLANT
CANISTER SUCT 3000ML PPV (MISCELLANEOUS) ×2 IMPLANT
CHLORAPREP W/TINT 26 (MISCELLANEOUS) ×2 IMPLANT
COVER SURGICAL LIGHT HANDLE (MISCELLANEOUS) ×2 IMPLANT
DRAPE LAPAROSCOPIC ABDOMINAL (DRAPES) ×2 IMPLANT
DRAPE WARM FLUID 44X44 (DRAPES) ×2 IMPLANT
DRSG OPSITE POSTOP 4X10 (GAUZE/BANDAGES/DRESSINGS) IMPLANT
DRSG OPSITE POSTOP 4X8 (GAUZE/BANDAGES/DRESSINGS) IMPLANT
ELECT BLADE 6.5 EXT (BLADE) IMPLANT
ELECT CAUTERY BLADE 6.4 (BLADE) ×4 IMPLANT
ELECT REM PT RETURN 9FT ADLT (ELECTROSURGICAL) ×1 IMPLANT
ELECTRODE REM PT RTRN 9FT ADLT (ELECTROSURGICAL) ×2 IMPLANT
GLOVE BIO SURGEON STRL SZ7 (GLOVE) ×2 IMPLANT
GLOVE BIOGEL PI IND STRL 7.5 (GLOVE) ×2 IMPLANT
GOWN STRL REUS W/ TWL LRG LVL3 (GOWN DISPOSABLE) ×4 IMPLANT
HANDLE SUCTION POOLE (INSTRUMENTS) ×2 IMPLANT
KIT BASIN OR (CUSTOM PROCEDURE TRAY) ×2 IMPLANT
KIT TURNOVER KIT B (KITS) ×2 IMPLANT
LIGASURE IMPACT 36 18CM CVD LR (INSTRUMENTS) IMPLANT
NDL HYPO 25GX1X1/2 BEV (NEEDLE) IMPLANT
NEEDLE HYPO 25GX1X1/2 BEV (NEEDLE) ×1 IMPLANT
NS IRRIG 1000ML POUR BTL (IV SOLUTION) ×4 IMPLANT
PACK GENERAL/GYN (CUSTOM PROCEDURE TRAY) ×2 IMPLANT
PAD ARMBOARD 7.5X6 YLW CONV (MISCELLANEOUS) ×2 IMPLANT
PENCIL SMOKE EVACUATOR (MISCELLANEOUS) ×2 IMPLANT
RELOAD PROXIMATE 75MM BLUE (ENDOMECHANICALS) ×2 IMPLANT
RELOAD STAPLE 75 3.8 BLU REG (ENDOMECHANICALS) IMPLANT
SPECIMEN JAR LARGE (MISCELLANEOUS) IMPLANT
SPONGE T-LAP 18X18 ~~LOC~~+RFID (SPONGE) IMPLANT
STAPLER GUN LINEAR PROX 60 (STAPLE) IMPLANT
STAPLER PROXIMATE 75MM BLUE (STAPLE) IMPLANT
STAPLER VISISTAT 35W (STAPLE) ×2 IMPLANT
SUCTION POOLE HANDLE (INSTRUMENTS) ×1 IMPLANT
SUT PDS AB 1 TP1 96 (SUTURE) ×4 IMPLANT
SUT SILK 2 0 SH CR/8 (SUTURE) ×2 IMPLANT
SUT SILK 2 0 TIES 10X30 (SUTURE) ×2 IMPLANT
SUT SILK 3 0 SH CR/8 (SUTURE) ×2 IMPLANT
SUT SILK 3 0 TIES 10X30 (SUTURE) ×2 IMPLANT
SUT VIC AB 3-0 SH 18 (SUTURE) IMPLANT
SYR CONTROL 10ML LL (SYRINGE) IMPLANT
TOWEL GREEN STERILE (TOWEL DISPOSABLE) ×2 IMPLANT
TRAY FOLEY MTR SLVR 16FR STAT (SET/KITS/TRAYS/PACK) IMPLANT
YANKAUER SUCT BULB TIP NO VENT (SUCTIONS) IMPLANT

## 2024-01-31 NOTE — Transfer of Care (Signed)
 Immediate Anesthesia Transfer of Care Note  Patient: [name]  Procedure(s) Performed: EXPLORATORY LAPAROTOMY (Abdomen) PARTIAL RIGHT COLECTOMY (Abdomen)  Patient Location: PACU  Anesthesia Type:General  Level of Consciousness: awake, drowsy, patient cooperative, and responds to stimulation  Airway & Oxygen Therapy: Patient Spontanous Breathing and Patient connected to nasal cannula oxygen  Post-op Assessment: Report given to RN and Post -op Vital signs reviewed and stable  Post vital signs: Reviewed and stable  Last Vitals:  Vitals Value Taken Time  BP 118/62 01/31/24 0948  Temp 98 01/31/24 0948  Pulse 69 01/31/24 0952  Resp 17 01/31/24 0952  SpO2 97 % 01/31/24 0952  Vitals shown include unfiled device data.  Patient denies pain      Complications: No notable events documented.

## 2024-01-31 NOTE — H&P (Addendum)
 CC: abdominal pain  Requesting provider: dr Bebe Shaggy  HPI: [name] is an 68 y.o. female who is here for persistent abdominal pain.  She just returned from a hunting trip from [place] on Monday.  She states that she developed "severe" mid abdominal pain on Tuesday.  She states that she tufted out and actually felt a little bit better on Wednesday.  She still did not have a great appetite.  She had nausea with the abdominal pain on Tuesday.  No fever or chills.  She had recurrence of the abdominal pain on Thursday it was not as severe but it was still pretty significant so she felt she should come to the emergency room to be evaluated.  No prior symptoms.  Only abdominal surgery in the past has been a C-section and a diagnostic laparoscopy in 2017.  She has a remote history of abdominoplasty.  No melena or hematochezia.  No dysuria.  She reports a remote history of colonoscopy 10 to 12 years ago with Dr. Loreta Ave.  She does struggle with chronic constipation.  She generally has to take magnesium or MiraLAX daily.  She may have a bowel movement every few days.  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  Managed by a pain medicine physician.  She had been on semaglutide up to about 1 month ago for weight.  Denies chest pain, chest pressure, shortness of breath, prior history of blood clots.  no tobacco or drug use.  Very rare alcohol  Past Medical History:  Diagnosis Date   Abnormality of gait 11/27/2013   Arthritis    Left shoulder   Basal cell carcinoma    Nose, x2   Chronic back pain    and neck pain   Concussion    Dyspareunia    Environmental allergies    Herpes    History of subdural hematoma (post traumatic)    Right temporal   Hypothyroid    Irritable bowel    Menopausal state    Skin cancer    Skull fracture [facility])     Past Surgical History:  Procedure Laterality Date   BACK SURGERY  2012   rods in back   BREAST CYST ASPIRATION Left  1990   CARPAL TUNNEL RELEASE     CESAREAN SECTION     COSMETIC SURGERY     abd   FRACTURE SURGERY Left 2007   plate in left, right ankle was fused   HYSTEROSCOPY WITH D & C N/A 11/09/2016   Procedure: DILATATION AND CURETTAGE /HYSTEROSCOPY;  Surgeon: Christeen Douglas, MD;  Location: [facility] ORS;  Service: Gynecology;  Laterality: N/A;   HYSTEROSCOPY WITH NOVASURE  2005   LAPAROSCOPY N/A 11/09/2016   Procedure: LAPAROSCOPY DIAGNOSTIC;  Surgeon: Christeen Douglas, MD;  Location: [facility];  Service: Gynecology;  Laterality: N/A;   lumbosacral spine fusion     L1 compression fracture, fusion at T11-L3   ORIF ANKLE FRACTURE     Bilateral   SHOULDER ARTHROSCOPY Right 12/07/2021   Procedure: ARTHROSCOPY SHOULDER DEBRIDEMENT;  Surgeon: Bjorn Pippin, MD;  Location: [facility];  Service: Orthopedics;  Laterality: Right;   SHOULDER ARTHROSCOPY WITH SUBACROMIAL DECOMPRESSION, ROTATOR CUFF REPAIR AND BICEP TENDON REPAIR Right 12/07/2021   Procedure: SHOULDER ARTHROSCOPY WITH SUBACROMIAL DECOMPRESSION, ROTATOR CUFF REPAIR  WITH PATCH GRAFT AUTOGRAFT AND BICEP TENDON REPAIR;  Surgeon: Bjorn Pippin, MD;  Location: [facility];  Service: Orthopedics;  Laterality: Right;  TENDON REPAIR Left    Left forearm   TUBAL LIGATION  1992    Family History  Problem Relation Age of Onset   Heart failure Mother    Hypertension Mother    Diabetes Mother    Prostate cancer Father    Heart Problems Father    Heart attack Brother    Breast cancer Paternal Grandmother 77   Cancer Neg Hx     Social:  reports that she has never smoked. She has never used smokeless tobacco. She reports current alcohol use. She reports that she does not use drugs.  Allergies:  Allergies  Allergen Reactions   Ibuprofen Other (See Comments)    abd cramps and rectal bleeding    Medications: I have reviewed the patient's current medications.   ROS - all of the below systems have been reviewed with the  patient and positives are indicated with bold text General: chills, fever or night sweats Eyes: blurry vision or double vision ENT: epistaxis or sore throat Allergy/Immunology: itchy/watery eyes or nasal congestion Hematologic/Lymphatic: bleeding problems, blood clots or swollen lymph nodes Endocrine: temperature intolerance or unexpected weight changes Breast: new or changing breast lumps or nipple discharge Resp: cough, shortness of breath, or wheezing CV: chest pain or dyspnea on exertion GI: as per HPI GU: dysuria, trouble voiding, or hematuria MSK: joint pain or joint stiffness Neuro: TIA or stroke symptoms Derm: pruritus and skin lesion changes Psych: anxiety and depression  PE Blood pressure 117/78, pulse 60, temperature 98.3 F (36.8 C), temperature source Oral, resp. rate 19, SpO2 98%. Constitutional: NAD; conversant; no deformities; not ill-appearing Eyes: Moist conjunctiva; no lid lag; anicteric; PERRL Neck: Trachea midline; no thyromegaly Lungs: Normal respiratory effort; no tactile fremitus CV: RRR; no palpable thrills; no pitting edema GI: Abd soft, old abdominoplasty scars not terribly distended, tender to palpation in right abdomen and lower abdomen.  No peritonitis.  No obvious hernia; no palpable hepatosplenomegaly MSK:  no clubbing/cyanosis Psychiatric: Appropriate affect; alert and oriented x3 Lymphatic: No palpable cervical or axillary lymphadenopathy Skin: No rash, lesions or jaundice  Results for orders placed or performed during the hospital encounter of 01/30/24 (from the past 48 hours)  Urinalysis, Routine w reflex microscopic -Urine, Clean Catch     Status: None   Collection Time: 01/30/24  8:49 PM  Result Value Ref Range   Color, Urine YELLOW YELLOW   APPearance CLEAR CLEAR   Specific Gravity, Urine 1.014 1.005 - 1.030   pH 6.0 5.0 - 8.0   Glucose, UA NEGATIVE NEGATIVE mg/dL   Hgb urine dipstick NEGATIVE NEGATIVE   Bilirubin Urine NEGATIVE NEGATIVE    Ketones, ur NEGATIVE NEGATIVE mg/dL   Protein, ur NEGATIVE NEGATIVE mg/dL   Nitrite NEGATIVE NEGATIVE   Leukocytes,Ua NEGATIVE NEGATIVE    Comment: Performed at [facility], [address], [place], [place] [address]  Lipase, blood     Status: None   Collection Time: 01/30/24  8:58 PM  Result Value Ref Range   Lipase 27 11 - 51 U/L    Comment: Performed at [facility], [address] [place], [place] [address]  Comprehensive metabolic panel     Status: Abnormal   Collection Time: 01/30/24  8:58 PM  Result Value Ref Range   Sodium 138 135 - 145 mmol/L   Potassium 4.2 3.5 - 5.1 mmol/L   Chloride 100 98 - 111 mmol/L   CO2 25 22 - 32 mmol/L   Glucose, Bld 94 70 -  99 mg/dL    Comment: Glucose reference range applies only to samples taken after fasting for at least 8 hours.   BUN 13 8 - 23 mg/dL   Creatinine, Ser 1.61 0.44 - 1.00 mg/dL   Calcium 9.1 8.9 - 09.6 mg/dL   Total Protein 6.4 (L) 6.5 - 8.1 g/dL   Albumin 3.6 3.5 - 5.0 g/dL   AST 23 15 - 41 U/L   ALT 21 0 - 44 U/L   Alkaline Phosphatase 36 (L) 38 - 126 U/L   Total Bilirubin 0.3 0.0 - 1.2 mg/dL   GFR, Estimated >04 >54 mL/min    Comment: (NOTE) Calculated using the CKD-EPI Creatinine Equation (2021)    Anion gap 13 5 - 15    Comment: Performed at [facility], [address], [place], [place] [address]  CBC     Status: None   Collection Time: 01/30/24  8:58 PM  Result Value Ref Range   WBC 4.7 4.0 - 10.5 K/uL   RBC 4.11 3.87 - 5.11 MIL/uL   Hemoglobin 12.3 12.0 - 15.0 g/dL   HCT 91.4 78.2 - 95.6 %   MCV 90.5 80.0 - 100.0 fL   MCH 29.9 26.0 - 34.0 pg   MCHC 33.1 30.0 - 36.0 g/dL   RDW 21.3 08.6 - 57.8 %   Platelets 241 150 - 400 K/uL   nRBC 0.0 0.0 - 0.2 %    Comment: Performed at [facility], [address], [place], [place] [address]    CT ABDOMEN PELVIS W CONTRAST Result Date: 01/31/2024 CLINICAL DATA:  Acute abdominal pain EXAM: CT ABDOMEN AND PELVIS WITH CONTRAST TECHNIQUE:  Multidetector CT imaging of the abdomen and pelvis was performed using the standard protocol following bolus administration of intravenous contrast. RADIATION DOSE REDUCTION: This exam was performed according to the departmental dose-optimization program which includes automated exposure control, adjustment of the mA and/or kV according to patient size and/or use of iterative reconstruction technique. CONTRAST:  50mL OMNIPAQUE IOHEXOL 350 MG/ML SOLN COMPARISON:  None Available. FINDINGS: Lower chest: No acute abnormality. Hepatobiliary: Gallstones are present. There is no biliary ductal dilatation. The liver is within normal limits. Pancreas: Unremarkable. No pancreatic ductal dilatation or surrounding inflammatory changes. Spleen: Normal in size without focal abnormality. Adrenals/Urinary Tract: Adrenal glands are unremarkable. Kidneys are normal, without renal calculi, focal lesion, or hydronephrosis. Bladder is unremarkable. Stomach/Bowel: Cecal tip is in the right upper quadrant. The appendix is within normal limits. Transition point is seen in the mid ascending colon coronal image 6/53 where there is focal narrowing and mild twisting. There is no wall thickening or inflammation identified. The cecum measures 8 cm and contains a large air-fluid level. The remaining colon is nondilated. There is a short segment small bowel intussusception without evidence for obstruction in the left abdomen image 3/36. Stomach is within normal limits. Vascular/Lymphatic: No significant vascular findings are present. No enlarged abdominal or pelvic lymph nodes. Reproductive: Uterine fibroids are present measuring up to 2.7 cm. The ovaries are not well delineated on this study. Other: No abdominal wall hernia or abnormality. No abdominopelvic ascites. Musculoskeletal: There is chronic appearing compression deformity of L1. IMPRESSION: 1. Findings compatible with partial cecal obstruction with transition point in the mid ascending  colon. 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 Cecal volvulus less likely as there is incomplete twisting. Follow-up recommended to exclude focal cecal lesion. 2. Short segment small bowel intussusception without evidence for obstruction. 3. Cholelithiasis. 4. Uterine fibroids.  5. Chronic appearing compression deformity of L1. Electronically Signed   By: Darliss Cheney M.D.   On: 01/31/2024 00:55    Imaging: Personally reviewed  A/P: [name] is an 68 y.o. female with Abdominal pain Cecal distension c/w Cecal bascule Small bowel intussusception -likely transient Uterine fibroids Hypothyroidism Chronic back pain -on nightly oxycodone Chronic constipation prediabetes  Agree with radiology that the cecum appears radiographically abnormal and concerning for some type of large bowel obstruction involving the cecum concerning for cecal bascule  I recommended laparotomy with partial colectomy for definitive management.  Also run the small bowel to evaluate the small bowel intussusception but feel that it is probably a transient phenomenon.  Recommended against detorsion and cecopexy because of risk of recurrence  I discussed the procedure in detail.    We discussed the risks and benefits of surgery including, but not limited to bleeding, infection (such as wound infection, abdominal abscess), injury to surrounding structures, blood clot formation, urinary retention, incisional hernia, anastomotic stricture, anastomotic leak, anesthesia risks, pulmonary & cardiac complications such as pneumonia &/or heart attack, need for additional procedures, ileus, & prolonged hospitalization.  We discussed the typical postoperative recovery course, including limitations & restrictions postoperatively. I explained that the likelihood of improvement in their symptoms is good.  Pt may be at increased risk for SSI given unprepped colon/colonic distension/partial obstruction.   Now her cecum is dilated 8 cm  it is less than 11 cm.  She has no leukocytosis.  She is afebrile.  She is not tachycardic.  She is not ill-appearing.  She does not have peritonitis.  Her lactate is normal.  There is no bowel wall thickening on imaging therefore I do not believe she needs urgent laparotomy in the middle the night  Will discuss this daytime surgical team and plan to do surgery later today N.p.o. IV fluids IV antibiotic on-call to the OR  Data reviewed: ED notes; ct, labs, vitals, op note 2017; pcp note 08/2023   Mary Sella. Andrey Campanile, MD, FACS General, Bariatric, & Minimally Invasive Surgery [facility]

## 2024-01-31 NOTE — Op Note (Signed)
 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 Lysle Rubens Anesthesia: General Endotracheal Indications: This is a 68 year old female with a history of chronic constipation who presented with worsening abdominal pain, nausea.  She was evaluated in the emergency department.  She was noted to have a very large dilated cecum with an obstruction just distal.  This is consistent with a cecal bascule.  Surgery is recommended.  Description of procedure: The patient is brought to the operating room and placed in the supine position on the operating room table.  After an adequate level of general anesthesia was obtained, a Foley catheter was placed under sterile technique.  The patient's abdomen was prepped with ChloraPrep and draped sterile fashion.  A timeout was taken to ensure the proper patient and proper procedure.  We made a vertical midline incision and dissected down through the subcutaneous tissues with cautery.  We opened the linea alba.  The patient has previous abdominoplasty sutures from the closure of her rectus diastases.  We remove the sutures.  We ended the linea alba and sharply entered the peritoneal cavity.  There is no sign of gross contamination within the peritoneal cavity.  We open the entire incision and visually inspected.  The small bowel appears normal in diameter.  All of the bowel appears to be viable.  The patient has a very large redundant right colon and cecum.  Currently there is no sign of obstruction.  The cecal bascule seems to have reduced.  We ran the small bowel from distal to proximal.  There is no sign of obstruction.  No sign of intussusception.  No sign of inflammation or infection.  The remainder the colon appeared relatively normal.  We mobilized the right colon by dividing the lateral attachments.  The cecum is very mobile and  moves up in the center of the wound.  We divided the terminal ileum 10 cm proximal to the ileocecal valve with a GIA 75 stapler.  We divided the ascending colon with a GIA 75 stapler.  The intervening mesentery was taken with the LigaSure device.  We created a side-to-side anastomosis with a GIA 75 stapler.  We closed the common enterotomy with a TX 60 stapler.  The staple line was oversewn with multiple interrupted 3-0 silk sutures.  The mesenteric defect is quite small and therefore was not closed.  We inspected our anastomosis which was felt to be patent with no sign of leak or bleed.  We covered this with omentum.  We irrigated the abdomen thoroughly and again inspected for hemostasis.  Our sponge, instrument, and needle count was correct.  We changed our gowns and gloves according to the colorectal protocol.  We then reapproximated the midline fascia with double-stranded #1 PDS suture.  We infiltrated Exparel into both sides of the fascial opening.  The subcutaneous tissues were irrigated and inspected for hemostasis.  We closed the skin with staples.  A honeycomb dressing is applied.  The patient is extubated and brought to recovery room stable condition.  All counts were correct.  Wilmon Arms. Corliss Skains, MD, [facility]  General Surgery   01/31/2024 9:38 AM

## 2024-01-31 NOTE — Plan of Care (Signed)

## 2024-01-31 NOTE — Anesthesia Preprocedure Evaluation (Signed)
 Anesthesia Evaluation  Patient identified by MRN, date of birth, ID band Patient awake    Reviewed: Allergy & Precautions, NPO status , Patient's Chart, lab work & pertinent test results, reviewed documented beta blocker date and time   History of Anesthesia Complications (+) PONV and history of anesthetic complications  Airway Mallampati: I  TM Distance: >3 FB     Dental no notable dental hx.    Pulmonary neg COPD, neg PE   breath sounds clear to auscultation       Cardiovascular (-) angina (-) CAD, (-) Past MI and (-) Cardiac Stents  Rhythm:Regular Rate:Normal     Neuro/Psych neg Seizures    GI/Hepatic hiatal hernia,neg GERD  ,,(+) neg Cirrhosis        Endo/Other  Hypothyroidism    Renal/GU Renal disease     Musculoskeletal  (+) Arthritis ,    Abdominal   Peds  Hematology   Anesthesia Other Findings   Reproductive/Obstetrics                              Anesthesia Physical Anesthesia Plan  ASA: 2  Anesthesia Plan: General   Post-op Pain Management:    Induction: Intravenous, Rapid sequence and Cricoid pressure planned  PONV Risk Score and Plan: 2  Airway Management Planned: Oral ETT  Additional Equipment:   Intra-op Plan:   Post-operative Plan: Extubation in OR  Informed Consent: I have reviewed the patients History and Physical, chart, labs and discussed the procedure including the risks, benefits and alternatives for the proposed anesthesia with the patient or authorized representative who has indicated his/her understanding and acceptance.     Dental advisory given  Plan Discussed with: CRNA  Anesthesia Plan Comments:          Anesthesia Quick Evaluation

## 2024-01-31 NOTE — Progress Notes (Signed)
 Tsuei messaged wants patient to remain on bowel rest as NPO w/ icechips & sips with meds for now.

## 2024-01-31 NOTE — Interval H&P Note (Signed)
 History and Physical Interval Note:  01/31/2024 7:54 AM  [name]  has presented today for surgery, with the diagnosis of cecal bascule.  The various methods of treatment have been discussed with the patient and family. After consideration of risks, benefits and other options for treatment, the patient has consented to  Procedure(s): EXPLORATORY LAPAROTOMY PARTIAL COLECTOMY (N/A) as a surgical intervention.  The patient's history has been reviewed, patient examined, no change in status, stable for surgery.  I have reviewed the patient's chart and labs.  Questions were answered to the patient's satisfaction.     [name]

## 2024-01-31 NOTE — ED Notes (Signed)
General Surg at bedside.

## 2024-01-31 NOTE — Anesthesia Procedure Notes (Signed)
 Procedure Name: Intubation Date/Time: 01/31/2024 8:10 AM  Performed by: Flonnie Hailstone, CRNAPre-anesthesia Checklist: Patient identified, Emergency Drugs available, Suction available and Patient being monitored Patient Re-evaluated:Patient Re-evaluated prior to induction Oxygen Delivery Method: Circle system utilized Preoxygenation: Pre-oxygenation with 100% oxygen Induction Type: IV induction, Rapid sequence and Cricoid Pressure applied Laryngoscope Size: Mac and 3 Grade View: Grade I Tube type: Oral Tube size: 7.0 mm Number of attempts: 1 Airway Equipment and Method: Stylet Placement Confirmation: ETT inserted through vocal cords under direct vision, positive ETCO2 and breath sounds checked- equal and bilateral Secured at: 21 cm Tube secured with: Tape Dental Injury: Teeth and Oropharynx as per pre-operative assessment  Comments: intubated by C. Horrace Hanak, CRNA; ebbs  cta

## 2024-01-31 NOTE — ED Provider Notes (Signed)
 [place] EMERGENCY DEPARTMENT AT [facility] Provider Note   CSN: [id number] Arrival date & time: 01/30/24  2031     History  Chief Complaint  Patient presents with   Abdominal Pain    [name] is a 68 y.o. female.  The history is provided by the patient and the spouse.  Patient with history of chronic back pain presents with abdominal pain.  She reports it started suddenly over 2 days ago and has been intermittent since that time.  Reports nausea but no vomiting.  No diarrhea.  She reports normal bowel movements, typically has some constipation due to chronic opiate use, no blood or melena.  No chest pain or shortness of breath.  She may have had a fever that has resolved.  She has never had this before.  Only surgery previously was a cesarean section. Patient reports last colonoscopy was over 10 years ago. Patient reports that she just got back from [place] for an 18-day hunting trip.  She had no fevers or illnesses during the trip.     Home Medications Prior to Admission medications   Medication Sig Start Date End Date Taking? Authorizing Provider  amitriptyline (ELAVIL) 25 MG tablet Take 25 mg by mouth at bedtime.    [provider]  docusate sodium (COLACE) 100 MG capsule Take 1 capsule (100 mg total) by mouth 2 (two) times daily. To keep stools soft 11/09/16   Christeen Douglas, MD  DULoxetine (CYMBALTA) 20 MG capsule Take 20 mg by mouth daily.  01/18/16   [provider]  fluticasone (FLONASE) 50 MCG/ACT nasal spray Place 1 spray into both nostrils daily as needed for allergies or rhinitis.    [provider]  levothyroxine (SYNTHROID, LEVOTHROID) 50 MCG tablet Take 50 mcg by mouth daily before breakfast.    [provider]  methocarbamol (ROBAXIN) 750 MG tablet Take 750 mg by mouth 3 (three) times daily.     [provider]  MOVANTIK 25 MG TABS tablet Take 25 mg by mouth daily.  02/09/16   [provider]  Multiple Vitamins-Minerals (MULTIVITAMIN WITH MINERALS) tablet Take 1 tablet by mouth daily.    [provider]  Oxycodone HCl 10 MG TABS Take 10 mg by mouth 2 (two) times daily as needed (pain).     [provider]  PRESCRIPTION MEDICATION Apply 1-2 g topically 4 (four) times daily as needed (pain). Apply to left hand for pain. Depends on pain level how much and how often patient uses.  Compounded by [employer] in [place], [place] 3% Diclofenac Lidocaine Cyclobenzoprine    [provider]  valACYclovir (VALTREX) 1000 MG tablet Take 1,000 mg by mouth daily as needed (outbreaks).     [provider]      Allergies    Ibuprofen    Review of Systems   Review of Systems  Constitutional:  Negative for fever.  Respiratory:  Negative for shortness of breath.   Cardiovascular:  Negative for chest pain.  Gastrointestinal:  Positive for abdominal pain and nausea. Negative for blood in stool.    Physical Exam Updated Vital Signs BP 117/78 (BP Location: Left Arm)   Pulse 60   Temp 98.3 F (36.8 C) (Oral)   Resp 19   SpO2 98%  Physical Exam CONSTITUTIONAL: Well developed/well nourished HEAD: Normocephalic/atraumatic ENMT: Mucous membranes moist NECK: supple no meningeal signs CV: S1/S2 noted, no murmurs/rubs/gallops noted LUNGS: Lungs are clear to auscultation bilaterally, no apparent distress  ABDOMEN: soft, diffuse tenderness, no distention, no rebound or guarding, bowel sounds noted throughout abdomen GU:no cva tenderness NEURO: Pt is awake/alert/appropriate, moves all extremitiesx4.  No facial droop.   EXTREMITIES: pulses normal/equal, full ROM SKIN: warm, color normal PSYCH: no abnormalities of mood noted, alert and oriented to situation  ED Results / Procedures / Treatments   Labs (all labs ordered are listed, but only abnormal results are displayed) Labs Reviewed  COMPREHENSIVE METABOLIC PANEL - Abnormal; Notable for  the following components:      Result Value   Total Protein 6.4 (*)    Alkaline Phosphatase 36 (*)    All other components within normal limits  LIPASE, BLOOD  CBC  URINALYSIS, ROUTINE W REFLEX MICROSCOPIC  LACTIC ACID, PLASMA    EKG None  Radiology CT ABDOMEN PELVIS W CONTRAST Result Date: 01/31/2024 CLINICAL DATA:  Acute abdominal pain EXAM: CT ABDOMEN AND PELVIS WITH CONTRAST TECHNIQUE: Multidetector CT imaging of the abdomen and pelvis was performed using the standard protocol following bolus administration of intravenous contrast. RADIATION DOSE REDUCTION: This exam was performed according to the departmental dose-optimization program which includes automated exposure control, adjustment of the mA and/or kV according to patient size and/or use of iterative reconstruction technique. CONTRAST:  50mL OMNIPAQUE IOHEXOL 350 MG/ML SOLN COMPARISON:  None Available. FINDINGS: Lower chest: No acute abnormality. Hepatobiliary: Gallstones are present. There is no biliary ductal dilatation. The liver is within normal limits. Pancreas: Unremarkable. No pancreatic ductal dilatation or surrounding inflammatory changes. Spleen: Normal in size without focal abnormality. Adrenals/Urinary Tract: Adrenal glands are unremarkable. Kidneys are normal, without renal calculi, focal lesion, or hydronephrosis. Bladder is unremarkable. Stomach/Bowel: Cecal tip is in the right upper quadrant. The appendix is within normal limits. Transition point is seen in the mid ascending colon coronal image 6/53 where there is focal narrowing and mild twisting. There is no wall thickening or inflammation identified. The cecum measures 8 cm and contains a large air-fluid level. The remaining colon is nondilated. There is a short segment small bowel intussusception without evidence for obstruction in the left abdomen image 3/36. Stomach is within normal limits. Vascular/Lymphatic: No significant vascular findings are present. No enlarged  abdominal or pelvic lymph nodes. Reproductive: Uterine fibroids are present measuring up to 2.7 cm. The ovaries are not well delineated on this study. Other: No abdominal wall hernia or abnormality. No abdominopelvic ascites. Musculoskeletal: There is chronic appearing compression deformity of L1. IMPRESSION: 1. Findings compatible with partial cecal obstruction with transition point in the mid ascending colon. 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 Cecal volvulus less likely as there is incomplete twisting. Follow-up recommended to exclude focal cecal lesion. 2. Short segment small bowel intussusception without evidence for obstruction. 3. Cholelithiasis. 4. Uterine fibroids. 5. Chronic appearing compression deformity of L1. Electronically Signed   By: Darliss Cheney M.D.   On: 01/31/2024 00:55    Procedures Procedures    Medications Ordered in ED Medications  iohexol (OMNIPAQUE) 350 MG/ML injection 50 mL (50 mLs Intravenous Contrast Given 01/30/24 2353)    ED Course/ Medical Decision Making/ A&P Clinical Course as of 01/31/24 0256  Fri Jan 31, 2024  0255 Discussed the case with Dr. Gaynelle Adu.  He plans to see the patient and admit for likely partial colectomy [DW]  0255 Patient resting comfortably, no acute distress, declines pain medicine [DW]    Clinical Course User Index [DW] Zadie Rhine, MD  Medical Decision Making Amount and/or Complexity of Data Reviewed Labs: ordered.  Risk Decision regarding hospitalization.   This patient presents to the ED for concern of abdominal pain, this involves an extensive number of treatment options, and is a complaint that carries with it a high risk of complications and morbidity.  The differential diagnosis includes but is not limited to cholecystitis, cholelithiasis, pancreatitis, gastritis, peptic ulcer disease, appendicitis, bowel obstruction, bowel perforation, diverticulitis, AAA, ischemic bowel     Comorbidities that complicate the patient evaluation: Patient's presentation is complicated by their history of chronic pain  Social Determinants of Health: Patient's  recent foreign travel   increases the complexity of managing their presentation  Additional history obtained: Additional history obtained from spouse Records reviewed Care Everywhere/External Records  Lab Tests: I Ordered, and personally interpreted labs.  The pertinent results include: Labs overall unremarkable  Imaging Studies ordered: I ordered imaging studies including CT scan abdomen pelvis   I independently visualized and interpreted imaging which showed intussusception I agree with the radiologist interpretation   Medicines ordered and prescription drug management: She declines pain meds   Consultations Obtained: I requested consultation with the admitting physician General Surgery , and discussed  findings as well as pertinent plan - they recommend: Will admit for operative management  Reevaluation: After the interventions noted above, I reevaluated the patient and found that they have :stayed the same  Complexity of problems addressed: Patient's presentation is most consistent with  acute presentation with potential threat to life or bodily function  Disposition: After consideration of the diagnostic results and the patient's response to treatment,  I feel that the patent would benefit from admission   .           Final Clinical Impression(s) / ED Diagnoses Final diagnoses:  Partial intestinal obstruction, unspecified cause [facility])    Rx / DC Orders ED Discharge Orders     None         Zadie Rhine, MD 01/31/24 [phone]

## 2024-02-01 LAB — CBC
HCT: 37.7 % (ref 36.0–46.0)
Hemoglobin: 12.6 g/dL (ref 12.0–15.0)
MCH: 30.2 pg (ref 26.0–34.0)
MCHC: 33.4 g/dL (ref 30.0–36.0)
MCV: 90.4 fL (ref 80.0–100.0)
Platelets: 201 10*3/uL (ref 150–400)
RBC: 4.17 MIL/uL (ref 3.87–5.11)
RDW: 14.2 % (ref 11.5–15.5)
WBC: 10.7 10*3/uL — ABNORMAL HIGH (ref 4.0–10.5)
nRBC: 0 % (ref 0.0–0.2)

## 2024-02-01 LAB — BASIC METABOLIC PANEL
Anion gap: 8 (ref 5–15)
BUN: 10 mg/dL (ref 8–23)
CO2: 27 mmol/L (ref 22–32)
Calcium: 8.6 mg/dL — ABNORMAL LOW (ref 8.9–10.3)
Chloride: 104 mmol/L (ref 98–111)
Creatinine, Ser: 0.97 mg/dL (ref 0.44–1.00)
GFR, Estimated: >60 mL/min (ref >60)
Glucose, Bld: 137 mg/dL — ABNORMAL HIGH (ref 70–99)
Potassium: 4.4 mmol/L (ref 3.5–5.1)
Sodium: 139 mmol/L (ref 135–145)

## 2024-02-01 NOTE — Plan of Care (Signed)
   Problem: Education: Goal: Knowledge of General Education information will improve Description Including pain rating scale, medication(s)/side effects and non-pharmacologic comfort measures Outcome: Progressing   Problem: Health Behavior/Discharge Planning: Goal: Ability to manage health-related needs will improve Outcome: Progressing

## 2024-02-01 NOTE — Progress Notes (Signed)
 1 Day Post-Op  Subjective: Reports some incisional pain that is relatively well controlled. Ambulated in the hall this morning.  Denies nausea or emesis.  + small flatus.   ROS: See above, otherwise other systems negative  Objective: Vital signs in last 24 hours: Temp:  [97.7 F (36.5 C)-99.9 F (37.7 C)] 98.7 F (37.1 C) (03/01 0804) Pulse Rate:  [60-81] 72 (03/01 0804) Resp:  [11-24] 18 (03/01 0804) BP: (89-129)/(53-81) 129/81 (03/01 0804) SpO2:  [94 %-100 %] 98 % (03/01 0804) Last BM Date : 01/29/24  Intake/Output from previous day: 02/28 0701 - 03/01 0700 In: 2600.5 [P.O.:30; I.V.:2470.5; IV Piggyback:100] Out: 2320 [Urine:2300; Blood:20] Intake/Output this shift: No intake/output data recorded.  PE: Gen: female, NAD Abd: soft, non-distended, appropriately tender, honeycomb dressing in place with minimal staining  Lab Results:  Recent Labs    01/30/24 2058 02/01/24 0245  WBC 4.7 10.7*  HGB 12.3 12.6  HCT 37.2 37.7  PLT 241 201   BMET Recent Labs    01/30/24 2058 02/01/24 0245  NA 138 139  K 4.2 4.4  CL 100 104  CO2 25 27  GLUCOSE 94 137*  BUN 13 10  CREATININE 0.86 0.97  CALCIUM 9.1 8.6*   PT/INR No results for input(s): "LABPROT", "INR" in the last 72 hours. CMP     Component Value Date/Time   NA 139 02/01/2024 0245   K 4.4 02/01/2024 0245   CL 104 02/01/2024 0245   CO2 27 02/01/2024 0245   GLUCOSE 137 (H) 02/01/2024 0245   BUN 10 02/01/2024 0245   CREATININE 0.97 02/01/2024 0245   CALCIUM 8.6 (L) 02/01/2024 0245   PROT 6.4 (L) 01/30/2024 2058   ALBUMIN 3.6 01/30/2024 2058   AST 23 01/30/2024 2058   ALT 21 01/30/2024 2058   ALKPHOS 36 (L) 01/30/2024 2058   BILITOT 0.3 01/30/2024 2058   GFRNONAA >60 02/01/2024 0245   GFRAA >60 10/31/2016 1040   Lipase     Component Value Date/Time   LIPASE 27 01/30/2024 2058    Studies/Results: DG Abd Portable 1V Result Date: 01/31/2024 CLINICAL DATA:  Abdominal pain.  Colon volvulus EXAM:  PORTABLE ABDOMEN - 1 VIEW COMPARISON:  Abdominal CT from yesterday FINDINGS: Stable bowel gas pattern. Gas in the proximal colon with up lifted cecum by CT. The cecum measures up to 5.5 cm in diameter where visible due to internal gas. Stool mainly in the descending colon. No abnormal intra-abdominal mass effect or gas collection. IMPRESSION: Stable compared to prior CT scanogram. Cecal distension is radiographically mild and nonprogressive. Electronically Signed   By: Tiburcio Pea M.D.   On: 01/31/2024 04:46   CT ABDOMEN PELVIS W CONTRAST Result Date: 01/31/2024 CLINICAL DATA:  Acute abdominal pain EXAM: CT ABDOMEN AND PELVIS WITH CONTRAST TECHNIQUE: Multidetector CT imaging of the abdomen and pelvis was performed using the standard protocol following bolus administration of intravenous contrast. RADIATION DOSE REDUCTION: This exam was performed according to the departmental dose-optimization program which includes automated exposure control, adjustment of the mA and/or kV according to patient size and/or use of iterative reconstruction technique. CONTRAST:  50mL OMNIPAQUE IOHEXOL 350 MG/ML SOLN COMPARISON:  None Available. FINDINGS: Lower chest: No acute abnormality. Hepatobiliary: Gallstones are present. There is no biliary ductal dilatation. The liver is within normal limits. Pancreas: Unremarkable. No pancreatic ductal dilatation or surrounding inflammatory changes. Spleen: Normal in size without focal abnormality. Adrenals/Urinary Tract: Adrenal glands are unremarkable. Kidneys are normal, without renal calculi, focal lesion, or hydronephrosis.  Bladder is unremarkable. Stomach/Bowel: Cecal tip is in the right upper quadrant. The appendix is within normal limits. Transition point is seen in the mid ascending colon coronal image 6/53 where there is focal narrowing and mild twisting. There is no wall thickening or inflammation identified. The cecum measures 8 cm and contains a large air-fluid level. The  remaining colon is nondilated. There is a short segment small bowel intussusception without evidence for obstruction in the left abdomen image 3/36. Stomach is within normal limits. Vascular/Lymphatic: No significant vascular findings are present. No enlarged abdominal or pelvic lymph nodes. Reproductive: Uterine fibroids are present measuring up to 2.7 cm. The ovaries are not well delineated on this study. Other: No abdominal wall hernia or abnormality. No abdominopelvic ascites. Musculoskeletal: There is chronic appearing compression deformity of L1. IMPRESSION: 1. Findings compatible with partial cecal obstruction with transition point in the mid ascending colon. 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 Cecal volvulus less likely as there is incomplete twisting. Follow-up recommended to exclude focal cecal lesion. 2. Short segment small bowel intussusception without evidence for obstruction. 3. Cholelithiasis. 4. Uterine fibroids. 5. Chronic appearing compression deformity of L1. Electronically Signed   By: Darliss Cheney M.D.   On: 01/31/2024 00:55    Anti-infectives: Anti-infectives (From admission, onward)    Start     Dose/Rate Route Frequency Ordered Stop   01/31/24 0600  cefoTEtan (CEFOTAN) 2 g in sodium chloride 0.9 % 100 mL IVPB        2 g 200 mL/hr over 30 Minutes Intravenous On call to O.R. 01/31/24 1610 01/31/24 0820       Assessment/Plan  68 y/o F now POD 1 from ex-lap, right hemicolectomy for a cecal bascule   - CLD today. Monitor for ROBF  Hypothyroidism - cont home synthroid Chronic back pain - mobilize out of bed, PRN oxy   FEN - CLD VTE - Lovenox ID - None Dispo - AROBF, med/surg   LOS: 1 day   Tacy [facility] Surgery 02/01/2024, 9:26 AM Please see Amion for pager number during day hours 7:00am-4:30pm or 7:00am -11:30am on weekends

## 2024-02-01 NOTE — Plan of Care (Signed)

## 2024-02-02 LAB — BASIC METABOLIC PANEL
Anion gap: 17 — ABNORMAL HIGH (ref 5–15)
BUN: 8 mg/dL (ref 8–23)
CO2: 21 mmol/L — ABNORMAL LOW (ref 22–32)
Calcium: 9.5 mg/dL (ref 8.9–10.3)
Chloride: 98 mmol/L (ref 98–111)
Creatinine, Ser: 0.74 mg/dL (ref 0.44–1.00)
GFR, Estimated: >60 mL/min (ref >60)
Glucose, Bld: 94 mg/dL (ref 70–99)
Potassium: 3.8 mmol/L (ref 3.5–5.1)
Sodium: 136 mmol/L (ref 135–145)

## 2024-02-02 LAB — CBC
HCT: 38.2 % (ref 36.0–46.0)
Hemoglobin: 12.9 g/dL (ref 12.0–15.0)
MCH: 30 pg (ref 26.0–34.0)
MCHC: 33.8 g/dL (ref 30.0–36.0)
MCV: 88.8 fL (ref 80.0–100.0)
Platelets: 229 10*3/uL (ref 150–400)
RBC: 4.3 MIL/uL (ref 3.87–5.11)
RDW: 13.9 % (ref 11.5–15.5)
WBC: 9.4 10*3/uL (ref 4.0–10.5)
nRBC: 0 % (ref 0.0–0.2)

## 2024-02-02 LAB — GLUCOSE, CAPILLARY: Glucose-Capillary: 104 mg/dL — ABNORMAL HIGH (ref 70–99)

## 2024-02-02 NOTE — Plan of Care (Signed)
  Problem: Education: Goal: Knowledge of General Education information will improve Description: Including pain rating scale, medication(s)/side effects and non-pharmacologic comfort measures Outcome: Progressing   Problem: Health Behavior/Discharge Planning: Goal: Ability to manage health-related needs will improve Outcome: Progressing   Problem: Clinical Measurements: Goal: Ability to maintain clinical measurements within normal limits will improve Outcome: Progressing Goal: Diagnostic test results will improve Outcome: Progressing   Problem: Activity: Goal: Risk for activity intolerance will decrease Outcome: Progressing   

## 2024-02-02 NOTE — Progress Notes (Signed)
 2 Days Post-Op   Subjective/Chief Complaint: Patient reports flatus - no BM yet Mild nausea Tolerating clears Pain controlled without narcotics   Objective: Vital signs in last 24 hours: Temp:  [97.7 F (36.5 C)-99.5 F (37.5 C)] 98.5 F (36.9 C) (03/02 0902) Pulse Rate:  [64-74] 65 (03/02 0902) Resp:  [16-18] 16 (03/02 0902) BP: (130-151)/(76-92) 149/84 (03/02 0902) SpO2:  [95 %-99 %] 97 % (03/02 0902) Last BM Date : 01/29/24  Intake/Output from previous day: 03/01 0701 - 03/02 0700 In: 360 [P.O.:360] Out: -  Intake/Output this shift: No intake/output data recorded.  WDWN in NAD Abd - non-distended Incision - dry staining on dressing; staple line c/d/i  Lab Results:  Recent Labs    02/01/24 0245 02/02/24 0626  WBC 10.7* 9.4  HGB 12.6 12.9  HCT 37.7 38.2  PLT 201 229   BMET Recent Labs    02/01/24 0245 02/02/24 0626  NA 139 136  K 4.4 3.8  CL 104 98  CO2 27 21*  GLUCOSE 137* 94  BUN 10 8  CREATININE 0.97 0.74  CALCIUM 8.6* 9.5   PT/INR No results for input(s): "LABPROT", "INR" in the last 72 hours. ABG No results for input(s): "PHART", "HCO3" in the last 72 hours.  Invalid input(s): "PCO2", "PO2"  Studies/Results: No results found.  Anti-infectives: Anti-infectives (From admission, onward)    Start     Dose/Rate Route Frequency Ordered Stop   01/31/24 0600  cefoTEtan (CEFOTAN) 2 g in sodium chloride 0.9 % 100 mL IVPB        2 g 200 mL/hr over 30 Minutes Intravenous On call to O.R. 01/31/24 4098 01/31/24 0820       Assessment/Plan: 68 y/o F now POD 2 from ex-lap, right hemicolectomy for a cecal bascule - Aleisha Paone 01/31/24   - CLD today. Monitor for ROBF   Hypothyroidism - cont home synthroid Chronic back pain - mobilize out of bed, PRN oxy     FEN - CLD VTE - Lovenox ID - None Dispo - AROBF, med/surg  LOS: 2 days    [name] 02/02/2024

## 2024-02-03 ENCOUNTER — Encounter (HOSPITAL_COMMUNITY): Payer: Self-pay | Admitting: Surgery

## 2024-02-03 LAB — SURGICAL PATHOLOGY

## 2024-02-03 MED ORDER — POLYETHYLENE GLYCOL 3350 17 G PO PACK
17.0000 g | PACK | Freq: Every day | ORAL | Status: DC
  Administered 2024-02-03 – 2024-02-06 (×3): 17 g via ORAL
  Filled 2024-02-03 (×3): qty 1

## 2024-02-03 MED ORDER — PSYLLIUM 95 % PO PACK
1.0000 | PACK | Freq: Every day | ORAL | Status: DC
  Administered 2024-02-03 – 2024-02-06 (×4): 1 via ORAL
  Filled 2024-02-03 (×5): qty 1

## 2024-02-03 MED ORDER — DOCUSATE SODIUM 100 MG PO CAPS
100.0000 mg | ORAL_CAPSULE | Freq: Two times a day (BID) | ORAL | Status: DC
  Administered 2024-02-03 – 2024-02-06 (×7): 100 mg via ORAL
  Filled 2024-02-03 (×7): qty 1

## 2024-02-03 MED ORDER — ENSURE ENLIVE PO LIQD
237.0000 mL | Freq: Two times a day (BID) | ORAL | Status: DC
  Administered 2024-02-03 – 2024-02-06 (×3): 237 mL via ORAL

## 2024-02-03 MED ORDER — OXYCODONE HCL 5 MG PO TABS
5.0000 mg | ORAL_TABLET | Freq: Four times a day (QID) | ORAL | Status: DC | PRN
  Administered 2024-02-03 – 2024-02-04 (×3): 5 mg via ORAL
  Filled 2024-02-03 (×3): qty 1

## 2024-02-03 NOTE — Progress Notes (Addendum)
    3 Days Post-Op  Subjective: CC: Patient reports stable to improved abdominal pain that is mostly around her midline incision, worse with movement, and well-controlled with medications.  Not requiring IV pain medication over the last 24 hours.  Tolerating CLD without nausea or vomiting.  Passing flatus.  No bowel movement.  Voiding.  Mobilizing.  Afebrile. No tachycardia or hypotension. WBC wnl and hgb stable on last check.    Objective: Vital signs in last 24 hours: Temp:  [98.1 F (36.7 C)-98.9 F (37.2 C)] 98.1 F (36.7 C) (03/03 0738) Pulse Rate:  [70-81] 81 (03/03 0738) Resp:  [16-18] 17 (03/03 0738) BP: (121-133)/(80-90) 133/83 (03/03 0738) SpO2:  [97 %-98 %] 97 % (03/03 0738) Last BM Date : 01/29/24  Intake/Output from previous day: 03/02 0701 - 03/03 0700 In: 1200 [P.O.:1200] Out: -  Intake/Output this shift: No intake/output data recorded.  PE: Gen:  Alert, NAD, pleasant Card:  Reg Pulm:  Rate and effort normal Abd: Soft, mild distension, generalized ttp that is greatest around her midline incision. No rigidity or guarding. +BS. Midline incision with honeycomb dressing in place, some dried blood on left lateral aspect but no signs of active bleeding and otherwise cdi   Lab Results:  Recent Labs    02/01/24 0245 02/02/24 0626  WBC 10.7* 9.4  HGB 12.6 12.9  HCT 37.7 38.2  PLT 201 229   BMET Recent Labs    02/01/24 0245 02/02/24 0626  NA 139 136  K 4.4 3.8  CL 104 98  CO2 27 21*  GLUCOSE 137* 94  BUN 10 8  CREATININE 0.97 0.74  CALCIUM 8.6* 9.5   PT/INR No results for input(s): "LABPROT", "INR" in the last 72 hours. CMP     Component Value Date/Time   NA 136 02/02/2024 0626   K 3.8 02/02/2024 0626   CL 98 02/02/2024 0626   CO2 21 (L) 02/02/2024 0626   GLUCOSE 94 02/02/2024 0626   BUN 8 02/02/2024 0626   CREATININE 0.74 02/02/2024 0626   CALCIUM 9.5 02/02/2024 0626   PROT 6.4 (L) 01/30/2024 2058   ALBUMIN 3.6 01/30/2024 2058   AST 23  01/30/2024 2058   ALT 21 01/30/2024 2058   ALKPHOS 36 (L) 01/30/2024 2058   BILITOT 0.3 01/30/2024 2058   GFRNONAA >60 02/02/2024 0626   GFRAA >60 10/31/2016 1040   Lipase     Component Value Date/Time   LIPASE 27 01/30/2024 2058    Studies/Results: No results found.  Anti-infectives: Anti-infectives (From admission, onward)    Start     Dose/Rate Route Frequency Ordered Stop   01/31/24 0600  cefoTEtan (CEFOTAN) 2 g in sodium chloride 0.9 % 100 mL IVPB        2 g 200 mL/hr over 30 Minutes Intravenous On call to O.R. 01/31/24 0353 01/31/24 0820        Assessment/Plan POD 3 s/p Exploratory laparotomy, right hemicolectomy by Dr. 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 None currently.    LOS: 3 days    Jacinto Halim, [facility] 02/03/2024, 10:44 AM Please see Amion for pager number during day hours 7:00am-4:30pm

## 2024-02-03 NOTE — Progress Notes (Signed)
   02/03/24 1628  TOC Brief Assessment  Insurance and Status Reviewed  Patient has primary care physician Yes  Home environment has been reviewed spouse  Prior level of function: independent  Prior/Current Home Services No current home services  Social Drivers of Health Review SDOH reviewed no interventions necessary  Transition of care needs no transition of care needs at this time        Transition of Care Department [facility]) has reviewed patient and no TOC needs have been identified at this time. We will continue to monitor patient advancement through interdisciplinary progression rounds. If new patient transition needs arise, please place a TOC consult.

## 2024-02-03 NOTE — Anesthesia Postprocedure Evaluation (Signed)
 Anesthesia Post Note  Patient: [name]  Procedure(s) Performed: EXPLORATORY LAPAROTOMY (Abdomen) PARTIAL RIGHT COLECTOMY (Abdomen)     Patient location during evaluation: PACU Anesthesia Type: General Level of consciousness: awake and alert Pain management: pain level controlled Vital Signs Assessment: post-procedure vital signs reviewed and stable Respiratory status: spontaneous breathing, nonlabored ventilation, respiratory function stable and patient connected to nasal cannula oxygen Cardiovascular status: blood pressure returned to baseline and stable Postop Assessment: no apparent nausea or vomiting Anesthetic complications: no   There were no known notable events for this encounter.                  Mariann Barter

## 2024-02-04 ENCOUNTER — Inpatient Hospital Stay (HOSPITAL_COMMUNITY)

## 2024-02-04 LAB — CBC
HCT: 39.9 % (ref 36.0–46.0)
Hemoglobin: 13.5 g/dL (ref 12.0–15.0)
MCH: 29.9 pg (ref 26.0–34.0)
MCHC: 33.8 g/dL (ref 30.0–36.0)
MCV: 88.3 fL (ref 80.0–100.0)
Platelets: 322 10*3/uL (ref 150–400)
RBC: 4.52 MIL/uL (ref 3.87–5.11)
RDW: 13.9 % (ref 11.5–15.5)
WBC: 8.7 10*3/uL (ref 4.0–10.5)
nRBC: 0 % (ref 0.0–0.2)

## 2024-02-04 LAB — BASIC METABOLIC PANEL
Anion gap: 16 — ABNORMAL HIGH (ref 5–15)
BUN: 6 mg/dL — ABNORMAL LOW (ref 8–23)
CO2: 24 mmol/L (ref 22–32)
Calcium: 9.3 mg/dL (ref 8.9–10.3)
Chloride: 98 mmol/L (ref 98–111)
Creatinine, Ser: 0.78 mg/dL (ref 0.44–1.00)
GFR, Estimated: >60 mL/min (ref >60)
Glucose, Bld: 100 mg/dL — ABNORMAL HIGH (ref 70–99)
Potassium: 3.4 mmol/L — ABNORMAL LOW (ref 3.5–5.1)
Sodium: 138 mmol/L (ref 135–145)

## 2024-02-04 MED ORDER — METHOCARBAMOL 500 MG PO TABS
500.0000 mg | ORAL_TABLET | Freq: Four times a day (QID) | ORAL | Status: DC
  Administered 2024-02-04 – 2024-02-06 (×9): 500 mg via ORAL
  Filled 2024-02-04 (×9): qty 1

## 2024-02-04 MED ORDER — OXYCODONE HCL 5 MG PO TABS: 5.0000 mg | ORAL_TABLET | Freq: Four times a day (QID) | ORAL | Status: DC | PRN

## 2024-02-04 MED ORDER — BISACODYL 10 MG RE SUPP
10.0000 mg | Freq: Once | RECTAL | Status: AC
  Administered 2024-02-04: 10 mg via RECTAL
  Filled 2024-02-04: qty 1

## 2024-02-04 MED ORDER — SIMETHICONE 80 MG PO CHEW
80.0000 mg | CHEWABLE_TABLET | Freq: Four times a day (QID) | ORAL | Status: DC
  Administered 2024-02-04 – 2024-02-06 (×9): 80 mg via ORAL
  Filled 2024-02-04 (×9): qty 1

## 2024-02-04 NOTE — Plan of Care (Signed)
  Problem: Education: Goal: Knowledge of General Education information will improve Description: Including pain rating scale, medication(s)/side effects and non-pharmacologic comfort measures Outcome: Progressing   Problem: Health Behavior/Discharge Planning: Goal: Ability to manage health-related needs will improve Outcome: Progressing   Problem: Clinical Measurements: Goal: Ability to maintain clinical measurements within normal limits will improve Outcome: Progressing Goal: Will remain free from infection Outcome: Progressing Goal: Diagnostic test results will improve Outcome: Progressing Goal: Respiratory complications will improve Outcome: Progressing Goal: Cardiovascular complication will be avoided Outcome: Progressing   Problem: Activity: Goal: Risk for activity intolerance will decrease Outcome: Progressing   Problem: Coping: Goal: Level of anxiety will decrease Outcome: Progressing   Problem: Elimination: Goal: Will not experience complications related to bowel motility Outcome: Progressing Goal: Will not experience complications related to urinary retention Outcome: Progressing   Problem: Pain Managment: Goal: General experience of comfort will improve and/or be controlled Outcome: Progressing   Problem: Safety: Goal: Ability to remain free from injury will improve Outcome: Progressing   Problem: Skin Integrity: Goal: Risk for impaired skin integrity will decrease Outcome: Progressing    @onset  of shift, pt voiced increased abdominal pain, no n/v, stated that since she had her diet advanced, her abd pain increased, she voiced she had a yogurt this morning, instructed to stay on clear liq for now. Early this morning pt stated pain improved, will pass on to day shift.

## 2024-02-04 NOTE — Progress Notes (Signed)
    4 Days Post-Op  Subjective: Had some volume soft food yesterday with worsening pain starting in the afternoon and increasing in the evening/overnight and states pain is severe this am. Pain is in the RLQ. She was passing flatus but has not since starting soft diet. She denies nausea. No BM since prior to surgery. She had been ambulating but pain limiting movement this am  Husband at bedside  Objective: Vital signs in last 24 hours: Temp:  [98 F (36.7 C)-98.5 F (36.9 C)] 98.5 F (36.9 C) (03/04 0335) Pulse Rate:  [72-110] 80 (03/04 0335) Resp:  [14-17] 14 (03/04 0335) BP: (112-147)/(78-101) 128/81 (03/04 0335) SpO2:  [95 %-99 %] 97 % (03/04 0335) Last BM Date : 01/30/24  Intake/Output from previous day: 03/03 0701 - 03/04 0700 In: 600 [P.O.:600] Out: -  Intake/Output this shift: No intake/output data recorded.  PE: Gen:  Alert, NAD, pleasant Card:  Reg Pulm:  Rate and effort normal Abd: Soft, mild distension, generalized ttp that is greatest around her midline incision. No rigidity or guarding. +BS. Midline incision with honeycomb dressing in place, some dried blood on left lateral aspect but no signs of active bleeding and otherwise cdi   Lab Results:  Recent Labs    02/02/24 0626  WBC 9.4  HGB 12.9  HCT 38.2  PLT 229   BMET Recent Labs    02/02/24 0626  NA 136  K 3.8  CL 98  CO2 21*  GLUCOSE 94  BUN 8  CREATININE 0.74  CALCIUM 9.5   PT/INR No results for input(s): "LABPROT", "INR" in the last 72 hours. CMP     Component Value Date/Time   NA 136 02/02/2024 0626   K 3.8 02/02/2024 0626   CL 98 02/02/2024 0626   CO2 21 (L) 02/02/2024 0626   GLUCOSE 94 02/02/2024 0626   BUN 8 02/02/2024 0626   CREATININE 0.74 02/02/2024 0626   CALCIUM 9.5 02/02/2024 0626   PROT 6.4 (L) 01/30/2024 2058   ALBUMIN 3.6 01/30/2024 2058   AST 23 01/30/2024 2058   ALT 21 01/30/2024 2058   ALKPHOS 36 (L) 01/30/2024 2058   BILITOT 0.3 01/30/2024 2058   GFRNONAA >60  02/02/2024 0626   GFRAA >60 10/31/2016 1040   Lipase     Component Value Date/Time   LIPASE 27 01/30/2024 2058    Studies/Results: No results found.  Anti-infectives: Anti-infectives (From admission, onward)    Start     Dose/Rate Route Frequency Ordered Stop   01/31/24 0600  cefoTEtan (CEFOTAN) 2 g in sodium chloride 0.9 % 100 mL IVPB        2 g 200 mL/hr over 30 Minutes Intravenous On call to O.R. 01/31/24 0353 01/31/24 0820        Assessment/Plan POD 4 s/p Exploratory laparotomy, right hemicolectomy by Dr. 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  - given pain with soft diet will check labs and get KUB. Discussed with patient to limit to sips of clears this am and ambulate as able - Mobilize - Pulm toilet  FEN - Soft VTE - SCDs, Lovenox ID - cefotetan peri-op. None currently.    LOS: 4 days    Eric Form, [facility] 02/04/2024, 8:39 AM Please see Amion for pager number during day hours 7:00am-4:30pm

## 2024-02-05 MED ORDER — POTASSIUM CHLORIDE CRYS ER 20 MEQ PO TBCR
40.0000 meq | EXTENDED_RELEASE_TABLET | Freq: Once | ORAL | Status: AC
  Administered 2024-02-05: 40 meq via ORAL
  Filled 2024-02-05: qty 2

## 2024-02-05 NOTE — Plan of Care (Signed)

## 2024-02-05 NOTE — Progress Notes (Signed)
 5 Days Post-Op  Subjective: CC: Feeling better today.  Bloating/distension resolved. BM last night around 7pm. Passing flatus. Some nausea yesterday after ensure. Tolerating cld without n/v this am. Pain around her incision that is worse with movement and well controlled with medications. Voiding. Mobilizing.   Afebrile. No tachycardia or hypotension. No labs done today. No I/O over the last 24 hours documented.   Objective: Vital signs in last 24 hours: Temp:  [98.5 F (36.9 C)-99.3 F (37.4 C)] 99.3 F (37.4 C) (03/05 0747) Pulse Rate:  [86-96] 90 (03/05 0747) Resp:  [16-20] 16 (03/05 0747) BP: (110-129)/(72-100) 111/82 (03/05 0747) SpO2:  [96 %-100 %] 98 % (03/05 0747) Last BM Date : 01/30/24  Intake/Output from previous day: No intake/output data recorded. Intake/Output this shift: No intake/output data recorded.  PE: Gen:  Alert, NAD, pleasant Card:  Reg Pulm:  CTAB, no W/R/R, effort normal Abd: Soft, mild distension, appropriately ttp around her incision, no rigidity or guarding and otherwise NT. +BS. Midline wound with staples in place, cdi Ext:  No LE edema  Psych: A&Ox3   Lab Results:  Recent Labs    02/04/24 0951  WBC 8.7  HGB 13.5  HCT 39.9  PLT 322   BMET Recent Labs    02/04/24 0951  NA 138  K 3.4*  CL 98  CO2 24  GLUCOSE 100*  BUN 6*  CREATININE 0.78  CALCIUM 9.3   PT/INR No results for input(s): "LABPROT", "INR" in the last 72 hours. CMP     Component Value Date/Time   NA 138 02/04/2024 0951   K 3.4 (L) 02/04/2024 0951   CL 98 02/04/2024 0951   CO2 24 02/04/2024 0951   GLUCOSE 100 (H) 02/04/2024 0951   BUN 6 (L) 02/04/2024 0951   CREATININE 0.78 02/04/2024 0951   CALCIUM 9.3 02/04/2024 0951   PROT 6.4 (L) 01/30/2024 2058   ALBUMIN 3.6 01/30/2024 2058   AST 23 01/30/2024 2058   ALT 21 01/30/2024 2058   ALKPHOS 36 (L) 01/30/2024 2058   BILITOT 0.3 01/30/2024 2058   GFRNONAA >60 02/04/2024 0951   GFRAA >60 10/31/2016 1040    Lipase     Component Value Date/Time   LIPASE 27 01/30/2024 2058    Studies/Results: DG Abd Portable 1V Result Date: 02/04/2024 CLINICAL DATA:  Postoperative abdominal pain. EXAM: PORTABLE ABDOMEN - 1 VIEW COMPARISON:  January 31, 2024. FINDINGS: No abnormal bowel dilatation. Midline surgical staples are noted. Moderate amount of stool is seen in the colon. IMPRESSION: No abnormal bowel dilatation. Electronically Signed   By: Lupita Raider M.D.   On: 02/04/2024 13:33    Anti-infectives: Anti-infectives (From admission, onward)    Start     Dose/Rate Route Frequency Ordered Stop   01/31/24 0600  cefoTEtan (CEFOTAN) 2 g in sodium chloride 0.9 % 100 mL IVPB        2 g 200 mL/hr over 30 Minutes Intravenous On call to O.R. 01/31/24 0353 01/31/24 0820        Assessment/Plan POD 5 s/p Exploratory laparotomy, right hemicolectomy by Dr. 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 My colleague has discussed with the patient - Adv diet.  - Mobilize - Pulm toilet   FEN - FLD. If tolerates, possible adv to soft diet this pm.  VTE - SCDs, Lovenox ID - cefotetan peri-op. None currently.    LOS: 5 days    Jacinto Halim, [facility] 02/05/2024,  7:49 AM Please see Amion for pager number during day hours 7:00am-4:30pm

## 2024-02-05 NOTE — Evaluation (Signed)
 Physical Therapy Evaluation and Discharge Patient Details Name: [name] MRN: [id number] DOB: Dec 10, 1955 Today's Date: 02/05/2024  History of Present Illness  Patient is a 68 year old female s/p exploratory laparotomy, right hemicolectomy on 01/31/24 for cecal bascule with obstruction.  Clinical Impression  Patient is agreeable to PT evaluation. She is independent at baseline and lives with her spouse.  She reports walking 4 laps in the hallway today already and is eager to go home soon. Education provided on techniques for getting out of bed and bracing abdomen for comfort with mobility. The patient is modified independent with all activity and has steady gait without assistive device. No further PT needs anticipated at this time. PT will sign off.       If plan is discharge home, recommend the following: Assist for transportation   Can travel by private vehicle        Equipment Recommendations None recommended by PT  Recommendations for Other Services       Functional Status Assessment Patient has not had a recent decline in their functional status     Precautions / Restrictions Precautions Precautions:  (low fall risk) Recall of Precautions/Restrictions: Intact Restrictions Weight Bearing Restrictions Per Provider Order: No      Mobility  Bed Mobility Overal bed mobility: Modified Independent (HOB elevated)             General bed mobility comments: encouraged logroll technique for comfort to protect abdominal incision. also gave patient instructions for bracing abdomen as needed with mobility for comfort using pillow    Transfers Overall transfer level: Needs assistance Equipment used: None Transfers: Sit to/from Stand Sit to Stand: Modified independent (Device/Increase time)           General transfer comment: good safety awareness with transfers    Ambulation/Gait Ambulation/Gait assistance: Modified independent (Device/Increase time) Gait  Distance (Feet): 40 Feet Assistive device: None Gait Pattern/deviations: Step-through pattern Gait velocity: decreased     General Gait Details: slow but steady ambulation without device. no dizziness reported. no unsteadiness. do not anticipate the need for DME at this time. patient has already completed 4 laps in the hallway today.  Stairs            Wheelchair Mobility     Tilt Bed    Modified Rankin (Stroke Patients Only)       Balance Overall balance assessment: Modified Independent                                           Pertinent Vitals/Pain Pain Assessment Pain Assessment: Faces Faces Pain Scale: Hurts even more Pain Location: abdominal incision Pain Descriptors / Indicators: Discomfort Pain Intervention(s): Monitored during session, Repositioned, Limited activity within patient's tolerance    Home Living Family/patient expects to be discharged to:: Private residence Living Arrangements: Spouse/significant other Available Help at Discharge: Family Type of Home: House Home Access: Stairs to enter Entrance Stairs-Rails: Doctor, general practice of Steps:  (a few steps)   Home Layout:  (split level with ramp to get to kitchen if needed)        Prior Function Prior Level of Function : Independent/Modified Independent                     Extremity/Trunk Assessment   Upper Extremity Assessment Upper Extremity Assessment: Overall WFL for tasks assessed    Lower  Extremity Assessment Lower Extremity Assessment: Overall WFL for tasks assessed       Communication   Communication Communication: No apparent difficulties    Cognition Arousal: Alert Behavior During Therapy: WFL for tasks assessed/performed   PT - Cognitive impairments: No apparent impairments                         Following commands: Intact       Cueing Cueing Techniques: Verbal cues     General Comments      Exercises      Assessment/Plan    PT Assessment Patient does not need any further PT services  PT Problem List         PT Treatment Interventions      PT Goals (Current goals can be found in the Care Plan section)  Acute Rehab PT Goals PT Goal Formulation: All assessment and education complete, DC therapy    Frequency       Co-evaluation               AM-PAC PT "6 Clicks" Mobility  Outcome Measure Help needed turning from your back to your side while in a flat bed without using bedrails?: None Help needed moving from lying on your back to sitting on the side of a flat bed without using bedrails?: None Help needed moving to and from a bed to a chair (including a wheelchair)?: None Help needed standing up from a chair using your arms (e.g., wheelchair or bedside chair)?: None Help needed to walk in hospital room?: None Help needed climbing 3-5 steps with a railing? : None 6 Click Score: 24    End of Session   Activity Tolerance: Patient limited by pain Patient left: in chair;with call bell/phone within reach   PT Visit Diagnosis: Muscle weakness (generalized) (M62.81)    Time: 6213-0865 PT Time Calculation (min) (ACUTE ONLY): 9 min   Charges:   PT Evaluation $PT Eval Low Complexity: 1 Low   PT General Charges $$ ACUTE PT VISIT: 1 Visit         Donna Bernard, PT, MPT   [name] 02/05/2024, 11:58 AM

## 2024-02-06 LAB — GLUCOSE, CAPILLARY: Glucose-Capillary: 111 mg/dL — ABNORMAL HIGH (ref 70–99)

## 2024-02-06 MED ORDER — ACETAMINOPHEN 500 MG PO TABS: 1000.0000 mg | ORAL_TABLET | Freq: Three times a day (TID) | ORAL | Status: DC | PRN

## 2024-02-06 NOTE — Plan of Care (Signed)

## 2024-02-06 NOTE — Discharge Summary (Signed)
 Patient ID: [name] [id number] [birth year] 68 y.o.  Admit date: 01/30/2024 Discharge date: 02/06/2024    Discharge Diagnosis S/p Exploratory laparotomy, right hemicolectomy by Dr. Corliss Skains on 01/31/24 for Cecal bascule   Consultants None  H&P [name] is an 68 y.o. female who is here for persistent abdominal pain.  She just returned from a hunting trip from [place] on Monday.  She states that she developed "severe" mid abdominal pain on Tuesday.  She states that she tufted out and actually felt a little bit better on Wednesday.  She still did not have a great appetite.  She had nausea with the abdominal pain on Tuesday.  No fever or chills.  She had recurrence of the abdominal pain on Thursday it was not as severe but it was still pretty significant so she felt she should come to the emergency room to be evaluated.  No prior symptoms.  Only abdominal surgery in the past has been a C-section and a diagnostic laparoscopy in 2017.  She has a remote history of abdominoplasty.  No melena or hematochezia.  No dysuria.  She reports a remote history of colonoscopy 10 to 12 years ago with Dr. Loreta Ave.   She does struggle with chronic constipation.  She generally has to take magnesium or MiraLAX daily.  She may have a bowel movement every few days.  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  Managed by a pain medicine physician.   She had been on semaglutide up to about 1 month ago for weight.   Denies chest pain, chest pressure, shortness of breath, prior history of blood clots.  no tobacco or drug use.  Very rare alcohol  Procedures Dr. Corliss Skains - 01/31/24 - Exploratory laparotomy, right hemicolectomy   Hospital Course:  Patient admitted as above and taken to the OR by Dr. Corliss Skains for Exploratory laparotomy, right hemicolectomy. Patient tolerated the procedure well. Diet advanced post op and initially tolerated but developed increased pain POD 4. Her diet was  backed down to CLD. On POD 5 her symptoms resolved and patient had return of bowel function. Diet advanced and tolerated. On POD 6, the patient was voiding well, tolerating diet, ambulating well, pain well controlled, vital signs stable, incisions c/d/i and felt stable for discharge home. She requested pain medication not be sent to her pharmacy. Follow up as noted below. Discussed discharge instructions, restrictions and return/call back precautions.   Allergies as of 02/06/2024       Reactions   Ibuprofen Other (See Comments)   abd cramps and rectal bleeding        Medication List     TAKE these medications    acetaminophen 500 MG tablet Commonly known as: TYLENOL Take 2 tablets (1,000 mg total) by mouth every 8 (eight) hours as needed.   amitriptyline 10 MG tablet Commonly known as: ELAVIL Take 10 mg by mouth at bedtime.   celecoxib 200 MG capsule Commonly known as: CELEBREX Take 200 mg by mouth 2 (two) times daily.   docusate sodium 100 MG capsule Commonly known as: COLACE Take 1 capsule (100 mg total) by mouth 2 (two) times daily. To keep stools soft   fluticasone 50 MCG/ACT nasal spray Commonly known as: FLONASE Place 1 spray into both nostrils daily as needed for allergies or rhinitis.   levothyroxine 50 MCG tablet Commonly known as: SYNTHROID Take 50 mcg by mouth daily before breakfast.   methocarbamol 750 MG  tablet Commonly known as: ROBAXIN Take 750 mg by mouth 3 (three) times daily.   multivitamin with minerals tablet Take 1 tablet by mouth daily.   oxyCODONE 5 MG immediate release tablet Commonly known as: Oxy IR/ROXICODONE Take 10 mg by mouth at bedtime.   valACYclovir 1000 MG tablet Commonly known as: VALTREX Take 1,000 mg by mouth daily as needed (outbreaks).          Follow-up Information     Manus Rudd, MD Follow up on 02/28/2024.   Specialty: General Surgery Why: 950am. Please arrive 30 minutes prior to your appointment for paperwork.  Please bring a copy of your photo ID and insurance card Contact information: [address] [place] [address] [phone]         [facility] Follow up on 02/14/2024.   Specialty: General Surgery Why: 10am. This is a nurse visit for staple removal. Please arrive 30 minutes early for paperwork. Contact information: [address] [place] [address] [phone]                 Signed: Leary Roca, [facility] 02/06/2024, 12:52 PM Please see Amion for pager number during day hours 7:00am-4:30pm

## 2024-02-06 NOTE — Progress Notes (Signed)
 6 Days Post-Op  Subjective: CC: Feeling better today.   Had some indigestion/felt like things slowed down yesterday but this resolved.   Finishing 3/4 of fld diet trays and had 1 ensure yesterday without n/v. Sore around her incision that is well controlled w/ scheduled medications. No new abdominal pain or worsening of her abdominal pain. No PRN pain or nausea medication over the last 24 hours. Passing flatus. Had a formed, non-bloody bm yesterday night. Voiding. Mobilizing - cleared by PT for no f/u.   Afebrile. No tachycardia or hypotension. No labs done today. No I/O over the last 24 hours documented.   Objective: Vital signs in last 24 hours: Temp:  [98.5 F (36.9 C)-98.7 F (37.1 C)] 98.5 F (36.9 C) (03/06 0411) Pulse Rate:  [84-94] 84 (03/06 0411) Resp:  [15-16] 15 (03/06 0411) BP: (122-135)/(82-85) 135/83 (03/06 0411) SpO2:  [95 %-97 %] 95 % (03/06 0411) Last BM Date : 01/30/24  Intake/Output from previous day: No intake/output data recorded. Intake/Output this shift: No intake/output data recorded.  PE: Gen:  Alert, NAD, pleasant Pulm:  Rate and effort normal Abd: Soft, improved mild distension, appropriately ttp around her incision, no rigidity or guarding and otherwise NT. +BS. Midline wound with staples in place, cdi Psych: A&Ox3   Lab Results:  Recent Labs    02/04/24 0951  WBC 8.7  HGB 13.5  HCT 39.9  PLT 322   BMET Recent Labs    02/04/24 0951  NA 138  K 3.4*  CL 98  CO2 24  GLUCOSE 100*  BUN 6*  CREATININE 0.78  CALCIUM 9.3   PT/INR No results for input(s): "LABPROT", "INR" in the last 72 hours. CMP     Component Value Date/Time   NA 138 02/04/2024 0951   K 3.4 (L) 02/04/2024 0951   CL 98 02/04/2024 0951   CO2 24 02/04/2024 0951   GLUCOSE 100 (H) 02/04/2024 0951   BUN 6 (L) 02/04/2024 0951   CREATININE 0.78 02/04/2024 0951   CALCIUM 9.3 02/04/2024 0951   PROT 6.4 (L) 01/30/2024 2058   ALBUMIN 3.6 01/30/2024 2058   AST 23  01/30/2024 2058   ALT 21 01/30/2024 2058   ALKPHOS 36 (L) 01/30/2024 2058   BILITOT 0.3 01/30/2024 2058   GFRNONAA >60 02/04/2024 0951   GFRAA >60 10/31/2016 1040   Lipase     Component Value Date/Time   LIPASE 27 01/30/2024 2058    Studies/Results: DG Abd Portable 1V Result Date: 02/04/2024 CLINICAL DATA:  Postoperative abdominal pain. EXAM: PORTABLE ABDOMEN - 1 VIEW COMPARISON:  January 31, 2024. FINDINGS: No abnormal bowel dilatation. Midline surgical staples are noted. Moderate amount of stool is seen in the colon. IMPRESSION: No abnormal bowel dilatation. Electronically Signed   By: Lupita Raider M.D.   On: 02/04/2024 13:33    Anti-infectives: Anti-infectives (From admission, onward)    Start     Dose/Rate Route Frequency Ordered Stop   01/31/24 0600  cefoTEtan (CEFOTAN) 2 g in sodium chloride 0.9 % 100 mL IVPB        2 g 200 mL/hr over 30 Minutes Intravenous On call to O.R. 01/31/24 0353 01/31/24 0820        Assessment/Plan POD 6 s/p Exploratory laparotomy, right hemicolectomy by Dr. 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 My colleague has discussed with the patient - Adv diet.  - Mobilize - Pulm toilet - Possible d/c this pm if tolerates diet advancement.  FEN - Soft diet VTE - SCDs, Lovenox ID - cefotetan peri-op. None currently.    LOS: 6 days    Jacinto Halim, [facility] 02/06/2024, 8:01 AM Please see Amion for pager number during day hours 7:00am-4:30pm

## 2024-02-06 NOTE — Discharge Instructions (Signed)
 [facility]      [facility], [place] [phone]  OPEN ABDOMINAL SURGERY: POST OP INSTRUCTIONS  Always review your discharge instruction sheet given to you by the facility where your surgery was performed.  IF YOU HAVE DISABILITY OR FAMILY LEAVE FORMS, YOU MUST BRING THEM TO THE OFFICE FOR PROCESSING.  PLEASE DO NOT GIVE THEM TO YOUR DOCTOR.  A prescription for pain medication may be given to you upon discharge.  Take your pain medication as prescribed, if needed.  If narcotic pain medicine is not needed, then you may take acetaminophen (Tylenol) or ibuprofen (Advil) as needed. Take your usually prescribed medications unless otherwise directed. If you need a refill on your pain medication, please contact your pharmacy. They will contact our office to request authorization.  Prescriptions will not be filled after 5pm or on week-ends. You should follow a light diet the first few days after arrival home, such as soup and crackers, pudding, etc.unless your doctor has advised otherwise. A high-fiber, low fat diet can be resumed as tolerated.   Be sure to include lots of fluids daily. Most patients will experience some swelling and bruising on the chest and neck area.  Ice packs will help.  Swelling and bruising can take several days to resolve Most patients will experience some swelling and bruising in the area of the incision. Ice pack will help. Swelling and bruising can take several days to resolve..  It is common to experience some constipation if taking pain medication after surgery.  Increasing fluid intake and taking a stool softener will usually help or prevent this problem from occurring.  A mild laxative (Milk of Magnesia or Miralax) should be taken according to package directions if there are no bowel movements after 48 hours.  You may have steri-strips (small skin tapes) in place directly over the incision.  These strips should be left on the skin for 7-10 days.  If your surgeon used skin  glue on the incision, you may shower in 24 hours.  The glue will flake off over the next 2-3 weeks.  Any sutures or staples will be removed at the office during your follow-up visit. You may find that a light gauze bandage over your incision may keep your staples from being rubbed or pulled. You may shower and replace the bandage daily. ACTIVITIES:  You may resume regular (light) daily activities beginning the next day--such as daily self-care, walking, climbing stairs--gradually increasing activities as tolerated.  You may have sexual intercourse when it is comfortable.  Refrain from any heavy lifting or straining until approved by your doctor. You may drive when you no longer are taking prescription pain medication, you can comfortably wear a seatbelt, and you can safely maneuver your car and apply brakes Return to Work: ___________________________________ Bonita Quin should see your doctor in the office for a follow-up appointment approximately two weeks after your surgery.  Make sure that you call for this appointment within a day or two after you arrive home to insure a convenient appointment time. OTHER INSTRUCTIONS:  _____________________________________________________________ _____________________________________________________________  WHEN TO CALL YOUR DOCTOR: Fever over 101.0 Inability to urinate Nausea and/or vomiting Extreme swelling or bruising Continued bleeding from incision. Increased pain, redness, or drainage from the incision. Difficulty swallowing or breathing Muscle cramping or spasms. Numbness or tingling in hands or feet or around lips.  The clinic staff is available to answer your questions during regular business hours.  Please don't hesitate to call and ask to speak to one of  the nurses if you have concerns.  For further questions, please visit [email or web address]

## 2024-04-21 ENCOUNTER — Telehealth: Admitting: Physician Assistant

## 2024-04-21 DIAGNOSIS — R1031 Right lower quadrant pain: Secondary | ICD-10-CM

## 2024-04-21 NOTE — Patient Instructions (Signed)
  Marceil Sensor, thank you for joining Hyla Maillard, PA-C for today's virtual visit.  While this provider is not your primary care provider (PCP), if your PCP is located in our provider database this encounter information will be shared with them immediately following your visit.   A [facility] MyChart account gives you access to today's visit and all your visits, tests, and labs performed at [facility] " click here if you don't have a [facility] MyChart account or go to [email or web address]  Consent: (Patient) [name] provided verbal consent for this virtual visit at the beginning of the encounter.  Current Medications:  Current Outpatient Medications:    amitriptyline  (ELAVIL ) 10 MG tablet, Take 10 mg by mouth at bedtime., Disp: , Rfl:    celecoxib  (CELEBREX ) 200 MG capsule, Take 200 mg by mouth 2 (two) times daily., Disp: , Rfl:    fluticasone (FLONASE) 50 MCG/ACT nasal spray, Place 1 spray into both nostrils daily as needed for allergies or rhinitis., Disp: , Rfl:    levothyroxine  (SYNTHROID , LEVOTHROID) 50 MCG tablet, Take 50 mcg by mouth daily before breakfast., Disp: , Rfl:    methocarbamol  (ROBAXIN ) 750 MG tablet, Take 750 mg by mouth 3 (three) times daily. , Disp: , Rfl:    Multiple Vitamins-Minerals (MULTIVITAMIN WITH MINERALS) tablet, Take 1 tablet by mouth daily., Disp: , Rfl:    oxyCODONE  (OXY IR/ROXICODONE ) 5 MG immediate release tablet, Take 10 mg by mouth at bedtime., Disp: , Rfl:    valACYclovir (VALTREX) 1000 MG tablet, Take 1,000 mg by mouth daily as needed (outbreaks). , Disp: , Rfl:    Medications ordered in this encounter:  No orders of the defined types were placed in this encounter.    *If you need refills on other medications prior to your next appointment, please contact your pharmacy*  Follow-Up: Call back or seek an in-person evaluation if the symptoms worsen or if the condition fails to improve as anticipated.  [facility] [phone]  Other Instructions Hydrate and rest. I have sent a copy of my note/message to your surgeon for review. If symptoms are recurring or any associated abdominal distention, vomiting, fever, or change in bowel habits, you will need an in-person evaluation ASAP , at the nearest ER if your PCP or surgeon cannot get you in.    If you have been instructed to have an in-person evaluation today at a local Urgent Care facility, please use the link below. It will take you to a list of all of our available [facility], including address, phone number and hours of operation. Please do not delay care.  [facility]  If you or a family member do not have a primary care provider, use the link below to schedule a visit and establish care. When you choose a [facility] primary care physician or advanced practice provider, you gain a long-term partner in health. Find a Primary Care Provider  Learn more about [facility] in-office and virtual care options:  - Get Care Now

## 2024-04-21 NOTE — Progress Notes (Signed)
 Virtual Visit Consent   [name], you are scheduled for a virtual visit with a [facility] provider today. Just as with appointments in the office, your consent must be obtained to participate. Your consent will be active for this visit and any virtual visit you may have with one of our providers in the next 365 days. If you have a MyChart account, a copy of this consent can be sent to you electronically.  As this is a virtual visit, video technology does not allow for your provider to perform a traditional examination. This may limit your provider's ability to fully assess your condition. If your provider identifies any concerns that need to be evaluated in person or the need to arrange testing (such as labs, EKG, etc.), we will make arrangements to do so. Although advances in technology are sophisticated, we cannot ensure that it will always work on either your end or our end. If the connection with a video visit is poor, the visit may have to be switched to a telephone visit. With either a video or telephone visit, we are not always able to ensure that we have a secure connection.  By engaging in this virtual visit, you consent to the provision of healthcare and authorize for your insurance to be billed (if applicable) for the services provided during this visit. Depending on your insurance coverage, you may receive a charge related to this service.  I need to obtain your verbal consent now. Are you willing to proceed with your visit today? [name] has provided verbal consent on 04/21/2024 for a virtual visit (video or telephone). Hyla Maillard, [place]  Date: 04/21/2024 5:37 PM   Virtual Visit via Video Note   I, Hyla Maillard, connected with  [name]  ([id number], 68-Aug-1957) on 04/21/24 at  5:15 PM EDT by a video-enabled telemedicine application and verified that I am speaking with the correct person using two identifiers.  Location: Patient: Virtual Visit Location  Patient: Home Provider: Virtual Visit Location Provider: Home Office   I discussed the limitations of evaluation and management by telemedicine and the availability of in person appointments. The patient expressed understanding and agreed to proceed.    History of Present Illness: [name] is a 68 y.o. who identifies as a female who was assigned female at birth, and is being seen today for pain in her R lower abdomen first noted this morning. Notes pain has been mild but associated with mild pressure in the area. 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 As such, she went to her surgeon's office today to see about an appointment, and was scheduled for next Tuesday, 5/27. Was told if anything worsened before then to go to the ER for imaging. She was wanting to know why they would not offer her imaging other than to go to the ER if they felt was needed. Notes now pain is improved. Still there but barely. Denies nausea/vomiting. Denies stool changes. Denies fever, chills.   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   Disease of thyroid  gland 07/27/2015   Neuritis or radiculitis due to rupture of lumbar  intervertebral disc 07/13/2014   Lumbar canal stenosis 07/13/2014   Abnormality of gait 11/27/2013    Allergies:  Allergies  Allergen Reactions   Ibuprofen  Other (See Comments)    abd cramps and rectal bleeding   Medications:  Current Outpatient Medications:    amitriptyline  (ELAVIL ) 10 MG tablet, Take 10 mg by mouth at bedtime., Disp: , Rfl:    celecoxib  (CELEBREX ) 200 MG capsule, Take 200 mg by mouth 2 (two) times daily., Disp: , Rfl:    fluticasone  (FLONASE) 50 MCG/ACT nasal spray, Place 1 spray into both nostrils daily as needed for allergies or rhinitis., Disp: , Rfl:    levothyroxine  (SYNTHROID , LEVOTHROID) 50 MCG tablet, Take 50 mcg by mouth daily before breakfast., Disp: , Rfl:    methocarbamol  (ROBAXIN ) 750 MG tablet, Take 750 mg by mouth 3 (three) times daily. , Disp: , Rfl:    Multiple Vitamins-Minerals (MULTIVITAMIN WITH MINERALS) tablet, Take 1 tablet by mouth daily., Disp: , Rfl:    oxyCODONE  (OXY IR/ROXICODONE ) 5 MG immediate release tablet, Take 10 mg by mouth at bedtime., Disp: , Rfl:    valACYclovir (VALTREX) 1000 MG tablet, Take 1,000 mg by mouth daily as needed (outbreaks). , Disp: , Rfl:   Observations/Objective: Patient is well-developed, well-nourished in no acute distress.  Resting comfortably at home.  Head is normocephalic, atraumatic.  No labored breathing. Speech is clear and coherent with logical content.  Patient is alert and oriented at baseline.   Assessment and Plan: 1. RLQ abdominal pain (Primary)  Very slight. No associated symptoms. Improved since AM. Just worried due to recent abdominal surgery. Discussed to continue good hydration, proper diet and monitor. Will send message to her Surgeon to make them aware of symptoms so they can follow-up with her on need for other assessment prior to her scheduled appt. Discussed though that since we cannot order imaging, etc., that if pain were to recur and increase, or any associated fever, obstipation, abdominal distention, she would need to seek an ER evaluation ASAP.  Follow Up Instructions: I discussed the assessment and treatment plan with the patient. The patient was provided an opportunity to ask questions and all were answered. The patient agreed with the plan and demonstrated an understanding of the instructions.  A copy of instructions were sent to the patient via MyChart unless otherwise noted below.   The patient was advised to call back or seek an  in-person evaluation if the symptoms worsen or if the condition fails to improve as anticipated.    Hyla Maillard, PA-C

## 2024-05-07 ENCOUNTER — Ambulatory Visit

## 2024-05-07 ENCOUNTER — Encounter
Admission: RE | Disposition: A | Discharge status: Home / Self Care | Payer: Self-pay | Source: Home / Self Care | Attending: Gastroenterology

## 2024-05-07 ENCOUNTER — Encounter: Payer: Self-pay | Admitting: Gastroenterology

## 2024-05-07 ENCOUNTER — Ambulatory Visit
Admission: RE | Admit: 2024-05-07 | Discharge: 2024-05-07 | Disposition: A | Discharge status: Home / Self Care | Attending: Gastroenterology | Admitting: Gastroenterology

## 2024-05-07 DIAGNOSIS — Z9049 Acquired absence of other specified parts of digestive tract: Secondary | ICD-10-CM | POA: Diagnosis not present

## 2024-05-07 DIAGNOSIS — Z1211 Encounter for screening for malignant neoplasm of colon: Secondary | ICD-10-CM | POA: Diagnosis present

## 2024-05-07 DIAGNOSIS — E039 Hypothyroidism, unspecified: Secondary | ICD-10-CM | POA: Insufficient documentation

## 2024-05-07 DIAGNOSIS — Z980 Intestinal bypass and anastomosis status: Secondary | ICD-10-CM | POA: Insufficient documentation

## 2024-05-07 HISTORY — PX: COLONOSCOPY: SHX5424

## 2024-05-07 SURGERY — COLONOSCOPY
Anesthesia: General

## 2024-05-07 MED ORDER — PROPOFOL 10 MG/ML IV BOLUS
INTRAVENOUS | Status: AC | PRN
  Administered 2024-05-07: 60 mg via INTRAVENOUS

## 2024-05-07 MED ORDER — SODIUM CHLORIDE 0.9 % IV SOLN: INTRAVENOUS | Status: DC

## 2024-05-07 MED ORDER — PROPOFOL 500 MG/50ML IV EMUL
INTRAVENOUS | Status: AC | PRN
  Administered 2024-05-07: 125 ug/kg/min via INTRAVENOUS

## 2024-05-07 NOTE — Op Note (Signed)
 [facility] Patient Name: [name] Procedure Date: 05/07/2024 8:24 AM MRN: [id number] Account #: [id number] Date of Birth: 11-17-1956 Admit Type: Outpatient Age: 68 Room: [facility] ENDO ROOM 1 Gender: Female Note Status: Finalized Instrument Name: Peds Colonoscope [id number] Procedure:             Colonoscopy Indications:           Screening for colorectal malignant neoplasm Providers:             Quintin Buckle DO, DO Medicines:             Monitored Anesthesia Care Complications:         No immediate complications. Estimated blood loss: None. Procedure:             Pre-Anesthesia Assessment:                        - Prior to the procedure, a History and Physical was                         performed, and patient medications and allergies were                         reviewed. The patient is competent. The risks and                         benefits of the procedure and the sedation options and                         risks were discussed with the patient. All questions                         were answered and informed consent was obtained.                         Patient identification and proposed procedure were                         verified by the physician, the nurse, the anesthetist                         and the technician in the endoscopy suite. Mental                         Status Examination: alert and oriented. Airway                         Examination: normal oropharyngeal airway and neck                         mobility. Respiratory Examination: clear to                         auscultation. CV Examination: RRR, no murmurs, no S3                         or S4. Prophylactic Antibiotics: The patient does not  require prophylactic antibiotics. Prior                         Anticoagulants: The patient has taken no anticoagulant                         or antiplatelet agents. ASA Grade Assessment: II - A                          patient with mild systemic disease. After reviewing                         the risks and benefits, the patient was deemed in                         satisfactory condition to undergo the procedure. The                         anesthesia plan was to use monitored anesthesia care                         (MAC). Immediately prior to administration of                         medications, the patient was re-assessed for adequacy                         to receive sedatives. The heart rate, respiratory                         rate, oxygen saturations, blood pressure, adequacy of                         pulmonary ventilation, and response to care were                         monitored throughout the procedure. The physical                         status of the patient was re-assessed after the                         procedure.                        After obtaining informed consent, the colonoscope was                         passed under direct vision. Throughout the procedure,                         the patient's blood pressure, pulse, and oxygen                         saturations were monitored continuously. The                         Colonoscope was introduced through the anus and  advanced to the the ileocolonic anastomosis. The                         colonoscopy was performed without difficulty. The                         patient tolerated the procedure well. The quality of                         the bowel preparation was good. The rectum and                         ileocecal anastomosis were photographed. Findings:      The perianal and digital rectal examinations were normal. Pertinent       negatives include normal sphincter tone.      There was evidence of a prior end-to-side ileo-colonic anastomosis in       the proximal transverse colon. This was patent and was characterized by       healthy appearing mucosa. The anastomosis was  traversed. Estimated blood       loss: none.      The entire examined colon appeared normal on direct and retroflexion       views. Impression:            - Patent end-to-side ileo-colonic anastomosis,                         characterized by healthy appearing mucosa.                        - The entire examined colon is normal on direct and                         retroflexion views.                        - No specimens collected. Recommendation:        - Patient has a contact number available for                         emergencies. The signs and symptoms of potential                         delayed complications were discussed with the patient.                         Return to normal activities tomorrow. Written                         discharge instructions were provided to the patient.                        - Discharge patient to home.                        - Resume previous diet.                        - Continue present medications.                        -  Repeat colonoscopy in 10 years for screening                         purposes.                        - Return to referring physician as previously                         scheduled.                        - The findings and recommendations were discussed with                         the patient. Procedure Code(s):     --- Professional ---                        [phone], Colonoscopy, flexible; diagnostic, including                         collection of specimen(s) by brushing or washing, when                         performed (separate procedure) Diagnosis Code(s):     --- Professional ---                        Z12.11, Encounter for screening for malignant neoplasm                         of colon                        Z98.0, Intestinal bypass and anastomosis status CPT copyright 2022 American Medical Association. All rights reserved. The codes documented in this report are preliminary and upon coder review may  be  revised to meet current compliance requirements. Attending Participation:      I personally performed the entire procedure. Polo Brisk, DO Quintin Buckle DO, DO 05/07/2024 9:09:35 AM This report has been signed electronically. Number of Addenda: 0 Note Initiated On: 05/07/2024 8:24 AM Scope Withdrawal Time: 0 hours 10 minutes 12 seconds  Total Procedure Duration: 0 hours 18 minutes 50 seconds  Estimated Blood Loss:  Estimated blood loss: none.      [facility]

## 2024-05-07 NOTE — Transfer of Care (Signed)
 Immediate Anesthesia Transfer of Care Note  Patient: [name]  Procedure(s) Performed: COLONOSCOPY  Patient Location: PACU  Anesthesia Type:General  Level of Consciousness: drowsy  Airway & Oxygen Therapy: Patient Spontanous Breathing  Post-op Assessment: Report given to RN, Post -op Vital signs reviewed and stable, and Patient moving all extremities  Post vital signs: Reviewed and stable  Last Vitals:  Vitals Value Taken Time  BP 121/71 05/07/24 0908  Temp    Pulse 59 05/07/24 0908  Resp 24 05/07/24 0908  SpO2 100 % 05/07/24 0908  Vitals shown include unfiled device data.  Last Pain:  Vitals:   05/07/24 0752  TempSrc: Temporal  PainSc: 0-No pain         Complications: No notable events documented.

## 2024-05-07 NOTE — Anesthesia Preprocedure Evaluation (Addendum)
 Anesthesia Evaluation  Patient identified by MRN, date of birth, ID band Patient awake    Reviewed: Allergy & Precautions, H&P , NPO status , Patient's Chart, lab work & pertinent test results  Airway Mallampati: II  TM Distance: >3 FB Neck ROM: full    Dental no notable dental hx.    Pulmonary neg pulmonary ROS   Pulmonary exam normal        Cardiovascular negative cardio ROS Normal cardiovascular exam     Neuro/Psych History of subdural hematoma (post traumatic) Chronic back pain  negative psych ROS   GI/Hepatic negative GI ROS, Neg liver ROS,,,S/p Exploratory laparotomy, right hemicolectomy by Dr. Eli Grizzle on 01/31/24 for Cecal bascule    Endo/Other  Hypothyroidism    Renal/GU negative Renal ROS  negative genitourinary   Musculoskeletal  (+) Arthritis ,    Abdominal   Peds  Hematology negative hematology ROS (+)   Anesthesia Other Findings Past Medical History: 11/27/2013: Abnormality of gait No date: Arthritis     Comment:  Left shoulder No date: Basal cell carcinoma     Comment:  Nose, x2 No date: Chronic back pain     Comment:  and neck pain No date: Concussion No date: Dyspareunia No date: Environmental allergies No date: Herpes No date: History of subdural hematoma (post traumatic)     Comment:  Right temporal No date: Hypothyroid No date: Irritable bowel No date: Menopausal state No date: Skin cancer No date: Skull fracture [facility])  Past Surgical History: 2012: BACK SURGERY     Comment:  rods in back 1990: BREAST CYST ASPIRATION; Left No date: CARPAL TUNNEL RELEASE No date: CESAREAN SECTION No date: COSMETIC SURGERY     Comment:  abd 2007: FRACTURE SURGERY; Left     Comment:  plate in left, right ankle was fused 11/09/2016: HYSTEROSCOPY WITH D & C; N/A     Comment:  Procedure: DILATATION AND CURETTAGE /HYSTEROSCOPY;                Surgeon: Prescilla Brod, MD;  Location: [facility] ORS;                 Service: Gynecology;  Laterality: N/A; 2005: HYSTEROSCOPY WITH NOVASURE 11/09/2016: LAPAROSCOPY; N/A     Comment:  Procedure: LAPAROSCOPY DIAGNOSTIC;  Surgeon: Prescilla Brod, MD;  Location: [facility] ORS;  Service: Gynecology;                Laterality: N/A; 01/31/2024: LAPAROTOMY; N/A     Comment:  Procedure: EXPLORATORY LAPAROTOMY;  Surgeon: Dareen Ebbing, MD;  Location: [facility] OR;  Service: General;                Laterality: N/A; No date: lumbosacral spine fusion     Comment:  L1 compression fracture, fusion at T11-L3 No date: ORIF ANKLE FRACTURE     Comment:  Bilateral 01/31/2024: PARTIAL COLECTOMY; N/A     Comment:  Procedure: PARTIAL RIGHT COLECTOMY;  Surgeon: Dareen Ebbing, MD;  Location: [facility] OR;  Service: General;                Laterality: N/A; 12/07/2021: SHOULDER ARTHROSCOPY; Right     Comment:  Procedure: ARTHROSCOPY SHOULDER DEBRIDEMENT;  Surgeon:  Micheline Ahr, MD;  Location: [facility];               Service: Orthopedics;  Laterality: Right; 12/07/2021: SHOULDER ARTHROSCOPY WITH SUBACROMIAL DECOMPRESSION,  ROTATOR CUFF REPAIR AND BICEP TENDON REPAIR; Right     Comment:  Procedure: SHOULDER ARTHROSCOPY WITH SUBACROMIAL               DECOMPRESSION, ROTATOR CUFF REPAIR  WITH PATCH GRAFT               AUTOGRAFT AND BICEP TENDON REPAIR;  Surgeon: Micheline Ahr, MD;  Location: [facility];  Service:               Orthopedics;  Laterality: Right; No date: TENDON REPAIR; Left     Comment:  Left forearm 1992: TUBAL LIGATION     Reproductive/Obstetrics negative OB ROS                             Anesthesia Physical Anesthesia Plan  ASA: 2  Anesthesia Plan: General   Post-op Pain Management:    Induction:   PONV Risk Score and Plan: Propofol  infusion and TIVA  Airway Management Planned:   Additional Equipment:   Intra-op Plan:    Post-operative Plan:   Informed Consent: I have reviewed the patients History and Physical, chart, labs and discussed the procedure including the risks, benefits and alternatives for the proposed anesthesia with the patient or authorized representative who has indicated his/her understanding and acceptance.     Dental Advisory Given  Plan Discussed with: CRNA and Surgeon  Anesthesia Plan Comments:         Anesthesia Quick Evaluation

## 2024-05-07 NOTE — H&P (Signed)
 Pre-Procedure H&P   Patient ID: [name] is a 68 y.o. female.  Gastroenterology Provider: Quintin Buckle, DO  Referring Provider: Dr. Claudius Cumins PCP: Yehuda Helms, MD  Date: 05/07/2024  HPI Ms. [name] is a 68 y.o. female who presents today for Colonoscopy for Colorectal cancer screening .  Patient with a history of opioid-induced constipation.  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  Her last colonoscopy was 10 to 12 years ago with Dr. Tova Fresh.  No melena or hematochezia  Hemoglobin 12.5 MCV 92 platelets 555,000  No family history of colon cancer or colon polyps   Past Medical History:  Diagnosis Date   Abnormality of gait 11/27/2013   Arthritis    Left shoulder   Basal cell carcinoma    Nose, x2   Chronic back pain    and neck pain   Concussion    Dyspareunia    Environmental allergies    Herpes    History of subdural hematoma (post traumatic)    Right temporal   Hypothyroid    Irritable bowel    Menopausal state    Skin cancer    Skull fracture (HCC)     Past Surgical History:  Procedure Laterality Date   BACK SURGERY  2012   rods in back   BREAST CYST ASPIRATION Left 1990   CARPAL TUNNEL RELEASE     CESAREAN SECTION     COLONOSCOPY     COSMETIC SURGERY     abd   FRACTURE SURGERY Left 2007   plate in left, right ankle was fused   HYSTEROSCOPY WITH D & C N/A 11/09/2016   Procedure: DILATATION AND CURETTAGE /HYSTEROSCOPY;  Surgeon: Prescilla Brod, MD;  Location: [facility];  Service: Gynecology;  Laterality: N/A;   HYSTEROSCOPY WITH NOVASURE  2005   LAPAROSCOPY N/A 11/09/2016   Procedure: LAPAROSCOPY DIAGNOSTIC;  Surgeon: Prescilla Brod, MD;  Location: [facility];  Service: Gynecology;  Laterality: N/A;   LAPAROTOMY N/A 01/31/2024   Procedure: EXPLORATORY LAPAROTOMY;  Surgeon: Dareen Ebbing, MD;  Location: [facility];  Service: General;  Laterality: N/A;   lumbosacral spine fusion     L1 compression  fracture, fusion at T11-L3   ORIF ANKLE FRACTURE     Bilateral   PARTIAL COLECTOMY N/A 01/31/2024   Procedure: PARTIAL RIGHT COLECTOMY;  Surgeon: Dareen Ebbing, MD;  Location: [facility] OR;  Service: General;  Laterality: N/A;   SHOULDER ARTHROSCOPY Right 12/07/2021   Procedure: ARTHROSCOPY SHOULDER DEBRIDEMENT;  Surgeon: Micheline Ahr, MD;  Location: [facility];  Service: Orthopedics;  Laterality: Right;   SHOULDER ARTHROSCOPY WITH SUBACROMIAL DECOMPRESSION, ROTATOR CUFF REPAIR AND BICEP TENDON REPAIR Right 12/07/2021   Procedure: SHOULDER ARTHROSCOPY WITH SUBACROMIAL DECOMPRESSION, ROTATOR CUFF REPAIR  WITH PATCH GRAFT AUTOGRAFT AND BICEP TENDON REPAIR;  Surgeon: Micheline Ahr, MD;  Location: [facility];  Service: Orthopedics;  Laterality: Right;   TENDON REPAIR Left    Left forearm   TUBAL LIGATION  1992    Family History No h/o GI disease or malignancy  Review of Systems  Constitutional:  Negative for activity change, appetite change, chills, diaphoresis, fatigue, fever and unexpected weight change.  HENT:  Negative for trouble swallowing and voice change.   Respiratory:  Negative for shortness of breath and wheezing.   Cardiovascular:  Negative for chest pain, palpitations and leg swelling.  Gastrointestinal:  Positive for constipation. Negative for abdominal distention, abdominal pain, anal bleeding,  blood in stool, diarrhea, nausea, rectal pain and vomiting.  Musculoskeletal:  Negative for arthralgias and myalgias.  Skin:  Negative for color change and pallor.  Neurological:  Negative for dizziness, syncope and weakness.  Psychiatric/Behavioral:  Negative for confusion.   All other systems reviewed and are negative.    Medications No current facility-administered medications on file prior to encounter.   Current Outpatient Medications on File Prior to Encounter  Medication Sig Dispense Refill   amitriptyline  (ELAVIL ) 10 MG tablet Take 10 mg by mouth  at bedtime.     celecoxib  (CELEBREX ) 200 MG capsule Take 200 mg by mouth 2 (two) times daily.     fluticasone (FLONASE) 50 MCG/ACT nasal spray Place 1 spray into both nostrils daily as needed for allergies or rhinitis.     levothyroxine  (SYNTHROID , LEVOTHROID) 50 MCG tablet Take 50 mcg by mouth daily before breakfast.     Multiple Vitamins-Minerals (MULTIVITAMIN WITH MINERALS) tablet Take 1 tablet by mouth daily.     oxyCODONE  (OXY IR/ROXICODONE ) 5 MG immediate release tablet Take 10 mg by mouth at bedtime.     methocarbamol  (ROBAXIN ) 750 MG tablet Take 750 mg by mouth 3 (three) times daily.      valACYclovir (VALTREX) 1000 MG tablet Take 1,000 mg by mouth daily as needed (outbreaks).       Pertinent medications related to GI and procedure were reviewed by me with the patient prior to the procedure   Current Facility-Administered Medications:    0.9 %  sodium chloride  infusion, , Intravenous, Continuous, Quintin Buckle, DO  sodium chloride          Allergies  Allergen Reactions   Ibuprofen  Other (See Comments)    abd cramps and rectal bleeding   Allergies were reviewed by me prior to the procedure  Objective   Body mass index is 22.25 kg/m. Vitals:   05/07/24 0752  BP: 116/84  Pulse: 60  Resp: 16  Temp: (!) 97.1 F (36.2 C)  TempSrc: Temporal  SpO2: 100%  Weight: 58.8 kg  Height: 5\' 4"  (1.626 m)     Physical Exam Vitals and nursing note reviewed.  Constitutional:      General: She is not in acute distress.    Appearance: Normal appearance. She is not ill-appearing, toxic-appearing or diaphoretic.  HENT:     Head: Normocephalic and atraumatic.     Nose: Nose normal.     Mouth/Throat:     Mouth: Mucous membranes are moist.     Pharynx: Oropharynx is clear.  Eyes:     General: No scleral icterus.    Extraocular Movements: Extraocular movements intact.  Cardiovascular:     Rate and Rhythm: Normal rate and regular rhythm.     Heart sounds: Normal heart  sounds. No murmur heard.    No friction rub. No gallop.  Pulmonary:     Effort: Pulmonary effort is normal. No respiratory distress.     Breath sounds: Normal breath sounds. No wheezing, rhonchi or rales.  Abdominal:     General: Bowel sounds are normal. There is no distension.     Palpations: Abdomen is soft.     Tenderness: There is no abdominal tenderness. There is no guarding or rebound.  Musculoskeletal:     Cervical back: Neck supple.     Right lower leg: No edema.     Left lower leg: No edema.  Skin:    General: Skin is warm and dry.     Coloration: Skin is not  jaundiced or pale.  Neurological:     General: No focal deficit present.     Mental Status: She is alert and oriented to person, place, and time. Mental status is at baseline.  Psychiatric:        Mood and Affect: Mood normal.        Behavior: Behavior normal.        Thought Content: Thought content normal.        Judgment: Judgment normal.      Assessment:  Ms. [name] is a 68 y.o. female  who presents today for Colonoscopy for colorectal cancer screening.  Plan:  Colonoscopy with possible intervention today  Colonoscopy with possible biopsy, control of bleeding, polypectomy, and interventions as necessary has been discussed with the patient/patient representative. Informed consent was obtained from the patient/patient representative after explaining the indication, nature, and risks of the procedure including but not limited to death, bleeding, perforation, missed neoplasm/lesions, cardiorespiratory compromise, and reaction to medications. Opportunity for questions was given and appropriate answers were provided. Patient/patient representative has verbalized understanding is amenable to undergoing the procedure.   Quintin Buckle, DO  [facility]  Portions of the record may have been created with voice recognition software. Occasional wrong-word or 'sound-a-like' substitutions  may have occurred due to the inherent limitations of voice recognition software.  Read the chart carefully and recognize, using context, where substitutions may have occurred.

## 2024-05-07 NOTE — Interval H&P Note (Signed)
 History and Physical Interval Note: Preprocedure H&P from 05/07/24  was reviewed and there was no interval change after seeing and examining the patient.  Written consent was obtained from the patient after discussion of risks, benefits, and alternatives. Patient has consented to proceed with Colonoscopy with possible intervention   05/07/2024 8:37 AM  [name]  has presented today for surgery, with the diagnosis of Screen for colon cancer (Z12.11).  The various methods of treatment have been discussed with the patient and family. After consideration of risks, benefits and other options for treatment, the patient has consented to  Procedure(s): COLONOSCOPY (N/A) as a surgical intervention.  The patient's history has been reviewed, patient examined, no change in status, stable for surgery.  I have reviewed the patient's chart and labs.  Questions were answered to the patient's satisfaction.     Quintin Buckle

## 2024-05-19 ENCOUNTER — Encounter: Payer: Self-pay | Admitting: Gastroenterology

## 2024-06-24 ENCOUNTER — Other Ambulatory Visit

## 2024-06-24 ENCOUNTER — Other Ambulatory Visit: Payer: Self-pay | Admitting: Orthopedic Surgery

## 2024-06-24 DIAGNOSIS — M47892 Other spondylosis, cervical region: Secondary | ICD-10-CM

## 2024-06-25 ENCOUNTER — Encounter: Payer: Self-pay | Admitting: Dermatology

## 2024-06-25 ENCOUNTER — Encounter: Payer: Self-pay | Admitting: Orthopedic Surgery

## 2024-06-25 ENCOUNTER — Ambulatory Visit: Admitting: Dermatology

## 2024-06-25 ENCOUNTER — Other Ambulatory Visit: Payer: Self-pay | Admitting: Orthopedic Surgery

## 2024-06-25 DIAGNOSIS — L209 Atopic dermatitis, unspecified: Secondary | ICD-10-CM | POA: Diagnosis not present

## 2024-06-25 DIAGNOSIS — L578 Other skin changes due to chronic exposure to nonionizing radiation: Secondary | ICD-10-CM

## 2024-06-25 DIAGNOSIS — L821 Other seborrheic keratosis: Secondary | ICD-10-CM

## 2024-06-25 DIAGNOSIS — W908XXA Exposure to other nonionizing radiation, initial encounter: Secondary | ICD-10-CM

## 2024-06-25 DIAGNOSIS — M4802 Spinal stenosis, cervical region: Secondary | ICD-10-CM

## 2024-06-25 DIAGNOSIS — M47892 Other spondylosis, cervical region: Secondary | ICD-10-CM

## 2024-06-25 DIAGNOSIS — L814 Other melanin hyperpigmentation: Secondary | ICD-10-CM

## 2024-06-25 DIAGNOSIS — M5412 Radiculopathy, cervical region: Secondary | ICD-10-CM

## 2024-06-25 DIAGNOSIS — D1801 Hemangioma of skin and subcutaneous tissue: Secondary | ICD-10-CM

## 2024-06-25 DIAGNOSIS — D229 Melanocytic nevi, unspecified: Secondary | ICD-10-CM

## 2024-06-25 DIAGNOSIS — Z1283 Encounter for screening for malignant neoplasm of skin: Secondary | ICD-10-CM

## 2024-06-25 DIAGNOSIS — Z85828 Personal history of other malignant neoplasm of skin: Secondary | ICD-10-CM

## 2024-06-25 MED ORDER — TRIAMCINOLONE ACETONIDE 0.1 % EX CREA: 1.0000 | TOPICAL_CREAM | Freq: Two times a day (BID) | CUTANEOUS | 1 refills | Status: AC

## 2024-06-25 NOTE — Progress Notes (Signed)
 New Patient Visit   Subjective  [name] is a 68 y.o. female who presents for the following: Skin Cancer Screening and Full Body Skin Exam hx of BCC, Rash lower legs ~4days, this episode, midly itchy, aloe cream, check spot R ant ankle comes and goes  The patient presents for Total-Body Skin Exam (TBSE) for skin cancer screening and mole check. The patient has spots, moles and lesions to be evaluated, some may be new or changing and the patient may have concern these could be cancer.    The following portions of the chart were reviewed this encounter and updated as appropriate: medications, allergies, medical history  Review of Systems:  No other skin or systemic complaints except as noted in HPI or Assessment and Plan.  Objective  Well appearing patient in no apparent distress; mood and affect are within normal limits.  A full examination was performed including scalp, head, eyes, ears, nose, lips, neck, chest, axillae, abdomen, back, buttocks, bilateral upper extremities, bilateral lower extremities, hands, feet, fingers, toes, fingernails, and toenails. All findings within normal limits unless otherwise noted below.   Exam of fingernails limited by presence of nail polish.   Relevant physical exam findings are noted in the Assessment and Plan.    Assessment & Plan   SKIN CANCER SCREENING PERFORMED TODAY.  ACTINIC DAMAGE - Chronic condition, secondary to cumulative UV/sun exposure - diffuse scaly erythematous macules with underlying dyspigmentation - Recommend daily broad spectrum sunscreen SPF 30+ to sun-exposed areas, reapply every 2 hours as needed.  - Staying in the shade or wearing long sleeves, sun glasses (UVA+UVB protection) and wide brim hats (4-inch brim around the entire circumference of the hat) are also recommended for sun protection.  - Call for new or changing lesions.  LENTIGINES, SEBORRHEIC KERATOSES, HEMANGIOMAS - Benign normal skin lesions -  Benign-appearing - Call for any changes  MELANOCYTIC NEVI - Tan-brown and/or pink-flesh-colored symmetric macules and papules - Benign appearing on exam today - Observation - Call clinic for new or changing moles - Recommend daily use of broad spectrum spf 30+ sunscreen to sun-exposed areas.   HISTORY OF BASAL CELL CARCINOMA OF THE SKIN - No evidence of recurrence today - Recommend regular full body skin exams - Recommend daily broad spectrum sunscreen SPF 30+ to sun-exposed areas, reapply every 2 hours as needed.  - Call if any new or changing lesions are noted between office visits  - R nose  ATOPIC DERMATITIS Lower legs Exam: numerous annular pink scaly patches scattered on lower legs   Atopic dermatitis (eczema) is a chronic, relapsing, pruritic condition that can significantly affect quality of life. It is often associated with allergic rhinitis and/or asthma and can require treatment with topical medications, phototherapy, or in severe cases biologic injectable medication (Dupixent; Adbry) or Oral JAK inhibitors.  Treatment Plan: Start TMC 0.1% cream bid aa lower legs unitl rash resolved, if after 2 weeks not improving plan bx, avoid f/g/a  Topical steroids (such as triamcinolone , fluocinolone, fluocinonide, mometasone, clobetasol, halobetasol, betamethasone, hydrocortisone) can cause thinning and lightening of the skin if they are used for too long in the same area. Your physician has selected the right strength medicine for your problem and area affected on the body. Please use your medication only as directed by your physician to prevent side effects.      ATOPIC DERMATITIS, UNSPECIFIED TYPE   Related Medications triamcinolone  cream (KENALOG ) 0.1 % Apply 1 Application topically 2 (two) times daily. Bid aa eczema  on lower legs until rash smooth and non itchy, avoid face, groin, axilla MULTIPLE BENIGN NEVI   LENTIGINES   ACTINIC ELASTOSIS   SEBORRHEIC  KERATOSES   CHERRY ANGIOMA    Return in about 1 year (around 06/25/2025) for TBSE, Hx of BCC.  I, Grayce Saunas, RMA, am acting as scribe for Boneta Sharps, MD .   Documentation: I have reviewed the above documentation for accuracy and completeness, and I agree with the above.  Boneta Sharps, MD

## 2024-06-25 NOTE — Patient Instructions (Signed)

## 2024-06-26 ENCOUNTER — Encounter: Payer: Self-pay | Admitting: Orthopedic Surgery

## 2024-07-02 ENCOUNTER — Inpatient Hospital Stay: Admission: RE | Admit: 2024-07-02 | Source: Ambulatory Visit

## 2024-07-03 ENCOUNTER — Ambulatory Visit
Admission: RE | Admit: 2024-07-03 | Discharge: 2024-07-03 | Disposition: A | Discharge status: Home / Self Care | Source: Ambulatory Visit | Attending: Orthopedic Surgery | Admitting: Orthopedic Surgery

## 2024-07-03 DIAGNOSIS — M47892 Other spondylosis, cervical region: Secondary | ICD-10-CM

## 2024-07-03 DIAGNOSIS — M4802 Spinal stenosis, cervical region: Secondary | ICD-10-CM

## 2024-07-03 DIAGNOSIS — M5412 Radiculopathy, cervical region: Secondary | ICD-10-CM

## 2024-07-08 ENCOUNTER — Ambulatory Visit
Admission: RE | Admit: 2024-07-08 | Discharge: 2024-07-08 | Disposition: A | Discharge status: Home / Self Care | Source: Ambulatory Visit | Attending: Orthopedic Surgery | Admitting: Orthopedic Surgery

## 2024-07-08 ENCOUNTER — Encounter: Payer: Self-pay | Admitting: Orthopedic Surgery

## 2024-07-08 ENCOUNTER — Other Ambulatory Visit: Payer: Self-pay | Admitting: Orthopedic Surgery

## 2024-07-08 DIAGNOSIS — M479 Spondylosis, unspecified: Secondary | ICD-10-CM

## 2024-08-20 ENCOUNTER — Other Ambulatory Visit: Payer: Self-pay | Admitting: Internal Medicine

## 2024-08-20 DIAGNOSIS — Z1231 Encounter for screening mammogram for malignant neoplasm of breast: Secondary | ICD-10-CM

## 2024-09-11 ENCOUNTER — Other Ambulatory Visit: Payer: Self-pay | Admitting: Orthopedic Surgery

## 2024-09-11 DIAGNOSIS — M5416 Radiculopathy, lumbar region: Secondary | ICD-10-CM

## 2024-09-11 DIAGNOSIS — M961 Postlaminectomy syndrome, not elsewhere classified: Secondary | ICD-10-CM

## 2024-09-11 DIAGNOSIS — M4316 Spondylolisthesis, lumbar region: Secondary | ICD-10-CM

## 2024-09-18 ENCOUNTER — Inpatient Hospital Stay: Admission: RE | Admit: 2024-09-18 | Source: Ambulatory Visit

## 2024-09-24 ENCOUNTER — Ambulatory Visit
Admission: RE | Admit: 2024-09-24 | Discharge: 2024-09-24 | Disposition: A | Discharge status: Home / Self Care | Source: Ambulatory Visit | Attending: Orthopedic Surgery | Admitting: Orthopedic Surgery

## 2024-09-24 DIAGNOSIS — M961 Postlaminectomy syndrome, not elsewhere classified: Secondary | ICD-10-CM

## 2024-09-24 DIAGNOSIS — M4316 Spondylolisthesis, lumbar region: Secondary | ICD-10-CM

## 2024-09-24 DIAGNOSIS — M5416 Radiculopathy, lumbar region: Secondary | ICD-10-CM

## 2025-06-28 ENCOUNTER — Ambulatory Visit: Admitting: Dermatology
